# Patient Record
Sex: Male | Born: 1990 | Race: White | Hispanic: No | Smoking: Never smoker
Health system: Southern US, Community
[De-identification: ages and names within clinical notes are randomized; demographics above are authoritative.]

## PROBLEM LIST (undated history)

## (undated) DIAGNOSIS — M545 Low back pain, unspecified: Secondary | ICD-10-CM

## (undated) DIAGNOSIS — F952 Tourette's disorder: Secondary | ICD-10-CM

## (undated) DIAGNOSIS — F909 Attention-deficit hyperactivity disorder, unspecified type: Secondary | ICD-10-CM

## (undated) DIAGNOSIS — G8929 Other chronic pain: Secondary | ICD-10-CM

## (undated) HISTORY — DX: Low back pain, unspecified: M54.50

## (undated) HISTORY — PX: NO PAST SURGERIES: SHX2092

## (undated) HISTORY — DX: Low back pain: M54.5

## (undated) HISTORY — DX: Tourette's disorder: F95.2

## (undated) HISTORY — DX: Low back pain, unspecified: G89.29

## (undated) HISTORY — DX: Attention-deficit hyperactivity disorder, unspecified type: F90.9

---
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
## 2011-01-02 ENCOUNTER — Institutional Professional Consult (permissible substitution): Payer: Medicaid Other | Admitting: Pediatrics

## 2011-01-02 DIAGNOSIS — R279 Unspecified lack of coordination: Secondary | ICD-10-CM

## 2011-01-02 DIAGNOSIS — F909 Attention-deficit hyperactivity disorder, unspecified type: Secondary | ICD-10-CM

## 2011-01-02 DIAGNOSIS — R6250 Unspecified lack of expected normal physiological development in childhood: Secondary | ICD-10-CM

## 2011-01-18 ENCOUNTER — Ambulatory Visit (HOSPITAL_COMMUNITY): Payer: Medicaid Other | Admitting: Licensed Clinical Social Worker

## 2011-03-08 ENCOUNTER — Ambulatory Visit (HOSPITAL_COMMUNITY): Payer: Medicaid Other | Admitting: Physician Assistant

## 2011-07-18 ENCOUNTER — Institutional Professional Consult (permissible substitution): Payer: Medicaid Other | Admitting: Pediatrics

## 2011-07-18 DIAGNOSIS — F909 Attention-deficit hyperactivity disorder, unspecified type: Secondary | ICD-10-CM

## 2011-07-18 DIAGNOSIS — R279 Unspecified lack of coordination: Secondary | ICD-10-CM

## 2011-07-18 DIAGNOSIS — R6250 Unspecified lack of expected normal physiological development in childhood: Secondary | ICD-10-CM

## 2012-11-22 ENCOUNTER — Ambulatory Visit (INDEPENDENT_AMBULATORY_CARE_PROVIDER_SITE_OTHER): Payer: BC Managed Care – PPO | Admitting: Physician Assistant

## 2012-11-22 VITALS — BP 95/65 | HR 98 | Temp 99.0°F | Resp 18 | Ht 72.0 in | Wt 191.0 lb

## 2012-11-22 DIAGNOSIS — J029 Acute pharyngitis, unspecified: Secondary | ICD-10-CM

## 2012-11-22 LAB — POCT CBC
Granulocyte percent: 73 %G (ref 37–80)
HCT, POC: 46.3 % (ref 43.5–53.7)
Hemoglobin: 15.7 g/dL (ref 14.1–18.1)
MCV: 90 fL (ref 80–97)
POC Granulocyte: 5.7 (ref 2–6.9)
POC LYMPH PERCENT: 19.8 %L (ref 10–50)
RBC: 5.15 M/uL (ref 4.69–6.13)
RDW, POC: 13 %

## 2012-11-22 LAB — POCT RAPID STREP A (OFFICE): Rapid Strep A Screen: NEGATIVE

## 2012-11-22 LAB — POCT INFLUENZA A/B
Influenza A, POC: NEGATIVE
Influenza B, POC: NEGATIVE

## 2012-11-22 MED ORDER — MAGIC MOUTHWASH W/LIDOCAINE: 10.0000 mL | ORAL | Status: DC | PRN

## 2012-11-22 MED ORDER — AMOXICILLIN 875 MG PO TABS: 875.0000 mg | ORAL_TABLET | Freq: Two times a day (BID) | ORAL | Status: DC

## 2012-11-22 NOTE — Patient Instructions (Signed)
Get plenty of rest and drink at least 64 ounces of water daily. 

## 2012-11-22 NOTE — Progress Notes (Signed)
Subjective:    Patient ID: Guy Walton, male    DOB: 1990-12-12, 21 y.o.   MRN: [id number]  HPI This 22 y.o. male presents for evaluation of sore throat and fever (subjective), that began 2 nights ago at work.  Describes weakness, fatigue, sore throat.  Loss of appetite.  Slept most of yesterday, and when awoke, felt worse.  No GI/GU symptoms.  Bones feel a little achey, maybe in the muscles. Mild cough.  LEFT ear with mild pain.  No HA. No known sick contacts.  History reviewed. No pertinent past medical history.  History reviewed. No pertinent past surgical history.  Prior to Admission medications   Not on File    No Known Allergies  History   Social History  . Marital Status: Single    Spouse Name: n/a    Number of Children: 0  . Years of Education: N/A   Occupational History  . Student     [employer]; physical therapy assistant program  . mezzanzine selector     [employer]   Social History Main Topics  . Smoking status: Never Smoker   . Smokeless tobacco: Never Used  . Alcohol Use: No  . Drug Use: No  . Sexually Active: Yes -- Male partner(s)   Other Topics Concern  . Not on file   Social History Narrative   Lives with his grandparents.    History reviewed. No pertinent family history.   Review of Systems As above.    Objective:   Physical Exam  Blood pressure 95/65, pulse 98, temperature 99 F (37.2 C), temperature source Oral, resp. rate 18, height 6' (1.829 m), weight 191 lb (86.637 kg). Body mass index is 25.9 kg/(m^2). Well-developed, well nourished WM who is awake, alert and oriented, in NAD. HEENT: [place]/AT, PERRL, EOMI.  Sclera and conjunctiva are clear.  EAC are patent, TMs are normal in appearance. Nasal mucosa is pink and moist. OP is clear. Neck: supple, non-tender, no lymphadenopathy, thyromegaly. Heart: RRR, no murmur Lungs: normal effort, CTA Extremities: no cyanosis, clubbing or edema. Skin: warm and dry without  rash. Psychologic: good mood and appropriate affect, normal speech and behavior.   Results for orders placed in visit on 11/22/12  POCT RAPID STREP A (OFFICE)      Result Value Range   Rapid Strep A Screen Negative  Negative  POCT INFLUENZA A/B      Result Value Range   Influenza A, POC Negative     Influenza B, POC Negative    POCT CBC      Result Value Range   WBC 7.8  4.6 - 10.2 K/uL   Lymph, poc 1.5  0.6 - 3.4   POC LYMPH PERCENT 19.8  10 - 50 %L   MID (cbc) 0.6  0 - 0.9   POC MID % 7.2  0 - 12 %M   POC Granulocyte 5.7  2 - 6.9   Granulocyte percent 73.0  37 - 80 %G   RBC 5.15  4.69 - 6.13 M/uL   Hemoglobin 15.7  14.1 - 18.1 g/dL   HCT, POC 40.9  81.1 - 53.7 %   MCV 90.0  80 - 97 fL   MCH, POC 30.5  27 - 31.2 pg   MCHC 33.9  31.8 - 35.4 g/dL   RDW, POC 91.4     Platelet Count, POC 193  142 - 424 K/uL   MPV 9.7  0 - 99.8 fL  Assessment & Plan:  Acute pharyngitis - Plan: POCT rapid strep A, Throat culture Loney Loh), POCT Influenza A/B, POCT CBC, amoxicillin (AMOXIL) 875 MG tablet, Alum & Mag Hydroxide-Simeth (MAGIC MOUTHWASH W/LIDOCAINE) SOLN  Suspect viral syndrome, but given the severity of the sore throat, cover for strep pharyngitis pending TCx results.  Work note provided, and if unable to RTW tomorrow evening he's to call for an extension.  Supportive care.  Anticipatory guidance provided.

## 2012-11-25 LAB — CULTURE, GROUP A STREP

## 2012-11-27 ENCOUNTER — Ambulatory Visit (INDEPENDENT_AMBULATORY_CARE_PROVIDER_SITE_OTHER): Payer: BC Managed Care – PPO | Admitting: Emergency Medicine

## 2012-11-27 VITALS — BP 102/70 | HR 61 | Temp 98.3°F | Resp 20 | Ht 72.5 in | Wt 189.0 lb

## 2012-11-27 DIAGNOSIS — R197 Diarrhea, unspecified: Secondary | ICD-10-CM

## 2012-11-27 DIAGNOSIS — R112 Nausea with vomiting, unspecified: Secondary | ICD-10-CM

## 2012-11-27 DIAGNOSIS — A088 Other specified intestinal infections: Secondary | ICD-10-CM

## 2012-11-27 LAB — POCT CBC
Hemoglobin: 13.7 g/dL — AB (ref 14.1–18.1)
Lymph, poc: 2 (ref 0.6–3.4)
MCH, POC: 29 pg (ref 27–31.2)
MCHC: 31.9 g/dL (ref 31.8–35.4)
MPV: 9.3 fL (ref 0–99.8)
POC Granulocyte: 4.6 (ref 2–6.9)
POC LYMPH PERCENT: 27.7 %L (ref 10–50)
POC MID %: 9 %M (ref 0–12)
RDW, POC: 13.1 %
WBC: 7.3 10*3/uL (ref 4.6–10.2)

## 2012-11-27 MED ORDER — LOPERAMIDE HCL 2 MG PO TABS: ORAL_TABLET | ORAL | Status: DC

## 2012-11-27 MED ORDER — ONDANSETRON 8 MG PO TBDP
8.0000 mg | ORAL_TABLET | Freq: Three times a day (TID) | ORAL | Status: DC | PRN
Start: 2012-11-27 — End: 2013-01-12

## 2012-11-27 NOTE — Patient Instructions (Addendum)
Viral Gastroenteritis Viral gastroenteritis is also known as stomach flu. This condition affects the stomach and intestinal tract. It can cause sudden diarrhea and vomiting. The illness typically lasts 3 to 8 days. Most people develop an immune response that eventually gets rid of the virus. While this natural response develops, the virus can make you quite ill. CAUSES  Many different viruses can cause gastroenteritis, such as rotavirus or noroviruses. You can catch one of these viruses by consuming contaminated food or water. You may also catch a virus by sharing utensils or other personal items with an infected person or by touching a contaminated surface. SYMPTOMS  The most common symptoms are diarrhea and vomiting. These problems can cause a severe loss of body fluids (dehydration) and a body salt (electrolyte) imbalance. Other symptoms may include:  Fever.  Headache.  Fatigue.  Abdominal pain. DIAGNOSIS  Your caregiver can usually diagnose viral gastroenteritis based on your symptoms and a physical exam. A stool sample may also be taken to test for the presence of viruses or other infections. TREATMENT  This illness typically goes away on its own. Treatments are aimed at rehydration. The most serious cases of viral gastroenteritis involve vomiting so severely that you are not able to keep fluids down. In these cases, fluids must be given through an intravenous line (IV). HOME CARE INSTRUCTIONS   Drink enough fluids to keep your urine clear or pale yellow. Drink small amounts of fluids frequently and increase the amounts as tolerated.  Ask your caregiver for specific rehydration instructions.  Avoid:  Foods high in sugar.  Alcohol.  Carbonated drinks.  Tobacco.  Juice.  Caffeine drinks.  Extremely hot or cold fluids.  Fatty, greasy foods.  Too much intake of anything at one time.  Dairy products until 24 to 48 hours after diarrhea stops.  You may consume probiotics.  Probiotics are active cultures of beneficial bacteria. They may lessen the amount and number of diarrheal stools in adults. Probiotics can be found in yogurt with active cultures and in supplements.  Wash your hands well to avoid spreading the virus.  Only take over-the-counter or prescription medicines for pain, discomfort, or fever as directed by your caregiver. Do not give aspirin to children. Antidiarrheal medicines are not recommended.  Ask your caregiver if you should continue to take your regular prescribed and over-the-counter medicines.  Keep all follow-up appointments as directed by your caregiver. SEEK IMMEDIATE MEDICAL CARE IF:   You are unable to keep fluids down.  You do not urinate at least once every 6 to 8 hours.  You develop shortness of breath.  You notice blood in your stool or vomit. This may look like coffee grounds.  You have abdominal pain that increases or is concentrated in one small area (localized).  You have persistent vomiting or diarrhea.  You have a fever.  The patient is a child younger than 3 months, and he or she has a fever.  The patient is a child older than 3 months, and he or she has a fever and persistent symptoms.  The patient is a child older than 3 months, and he or she has a fever and symptoms suddenly get worse.  The patient is a baby, and he or she has no tears when crying. MAKE SURE YOU:   Understand these instructions.  Will watch your condition.  Will get help right away if you are not doing well or get worse. Document Released: 09/03/2005 Document Revised: 11/26/2011 Document Reviewed: 06/20/2011   ExitCare Patient Information 2013 [employer].  

## 2012-11-27 NOTE — Progress Notes (Signed)
[facility] [address], [place] [address] [phone]  Date:  11/27/2012   Name:  Guy Walton   DOB:  Jan 01, 1991   MRN:  [id number]  PCP:  Magdalen Spatz., MD    Chief Complaint: Diarrhea and Emesis   History of Present Illness:  Guy Walton is a 22 y.o. 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  Felt mid abdominal pain and had nausea and sensation to move his bowels.  Finally vomited and had one episode of loose watery stools.  No fever or chills.  The patient has no complaint of blood, mucous, or pus in her stools.  Still has nausea and abdominal pain.  No sick contacts.  Friday developed sore throat and treated for strep.  Has been on amoxicillin since.  There is no problem list on file for this patient.   No past medical history on file.  No past surgical history on file.  History  Substance Use Topics  . Smoking status: Never Smoker   . Smokeless tobacco: Never Used  . Alcohol Use: No    No family history on file.  No Known Allergies  Medication list has been reviewed and updated.  Current Outpatient Prescriptions on File Prior to Visit  Medication Sig Dispense Refill  . amoxicillin (AMOXIL) 875 MG tablet Take 1 tablet (875 mg total) by mouth 2 (two) times daily.  20 tablet  0  . Alum & Mag Hydroxide-Simeth (MAGIC MOUTHWASH W/LIDOCAINE) SOLN Take 10 mLs by mouth every 2 (two) hours as needed.  360 mL  0   No current facility-administered medications on file prior to visit.    Review of Systems:  As per HPI, otherwise negative.    Physical Examination: Filed Vitals:   11/27/12 1908  BP: 102/70  Pulse: 61  Temp: 98.3 F (36.8 C)  Resp: 20   Filed Vitals:   11/27/12 1908  Height: 6' 0.5" (1.842 m)  Weight: 189 lb (85.73 kg)   Body mass index is 25.27 kg/(m^2). Ideal Body Weight: Weight in (lb) to have BMI = 25: 186.5  GEN: WDWN, NAD, Non-toxic, A  & O x 3 HEENT: Atraumatic, Normocephalic. Neck supple. No masses, No LAD. Ears and Nose: No external deformity. CV: RRR, No M/G/R. No JVD. No thrill. No extra heart sounds. PULM: CTA B, no wheezes, crackles, rhonchi. No retractions. No resp. distress. No accessory muscle use. ABD: S, mild LLQ tenderness, ND, +BS. No rebound. No HSM. EXTR: No c/c/e NEURO Normal gait.  PSYCH: Normally interactive. Conversant. Not depressed or anxious appearing.  Calm demeanor.    Assessment and Plan: Nausea and vomiting Current antibiotic treatment of strep with amoxicillin Consider C.dif  Carmelina Dane, MD  Results for orders placed in visit on 11/27/12  POCT CBC      Result Value Range   WBC 7.3  4.6 - 10.2 K/uL   Lymph, poc 2.0  0.6 - 3.4   POC LYMPH PERCENT 27.7  10 - 50 %L   MID (cbc) 0.7  0 - 0.9   POC MID % 9.0  0 - 12 %M   POC Granulocyte 4.6  2 - 6.9   Granulocyte percent 63.3  37 - 80 %G   RBC 4.72  4.69 - 6.13 M/uL   Hemoglobin 13.7 (*) 14.1 - 18.1 g/dL   HCT, POC 32.4 (*) 40.1 - 53.7 %  MCV 90.8  80 - 97 fL   MCH, POC 29.0  27 - 31.2 pg   MCHC 31.9  31.8 - 35.4 g/dL   RDW, POC 16.1     Platelet Count, POC 179  142 - 424 K/uL   MPV 9.3  0 - 99.8 fL

## 2012-12-03 LAB — CLOSTRIDIUM DIFFICILE EIA: CDIFTX: NEGATIVE

## 2013-01-12 ENCOUNTER — Ambulatory Visit: Payer: BC Managed Care – PPO

## 2013-01-12 ENCOUNTER — Ambulatory Visit (INDEPENDENT_AMBULATORY_CARE_PROVIDER_SITE_OTHER): Payer: BC Managed Care – PPO | Admitting: Emergency Medicine

## 2013-01-12 VITALS — BP 128/82 | HR 69 | Temp 98.5°F | Resp 16 | Ht 73.0 in | Wt 185.0 lb

## 2013-01-12 DIAGNOSIS — R112 Nausea with vomiting, unspecified: Secondary | ICD-10-CM

## 2013-01-12 DIAGNOSIS — R109 Unspecified abdominal pain: Secondary | ICD-10-CM

## 2013-01-12 DIAGNOSIS — R197 Diarrhea, unspecified: Secondary | ICD-10-CM

## 2013-01-12 MED ORDER — PROMETHAZINE HCL 25 MG PO TABS: 25.0000 mg | ORAL_TABLET | Freq: Four times a day (QID) | ORAL | Status: DC | PRN

## 2013-01-12 MED ORDER — DICYCLOMINE HCL 10 MG PO CAPS: 10.0000 mg | ORAL_CAPSULE | Freq: Three times a day (TID) | ORAL | Status: DC

## 2013-01-12 NOTE — Patient Instructions (Addendum)
Drink plenty of fluids, and gradually advance your diet as you are able.  The sooner you resume your normal diet, the sooner the diarrhea will resolve.  To help reduce constipation and promote bowel health, 1. Drink at least 64 ounces of water each day; 2. Eat plenty of fiber (fruits, vegetables, whole grains, legumes) 3. Get plenty of physical activity  If needed, use a stool softener (docusate) or an osmotic laxative (like Miralax) each day, or as needed.

## 2013-01-12 NOTE — Progress Notes (Signed)
  Subjective:    Patient ID: GAUGE WINSKI, male    DOB: Sep 21, 1990, 21 y.o.   MRN: [id number]  HPI  This 22 y.o. male presents for evaluation of abdominal pain and diarrhea. Awoke from sleep last evening with abdominal pain and need to defecate-diarrhea.  All night at work had diarrhea, and rectum is sore.  Vomited x 1 yesterday after eating chicken pot pie and baked beans, nausea is constant. 16-20 oz Gatorade overnight. Multiple contacts with similar symptoms at work.  Additionally, he notes several weeks of nasal and sinus congestion and pressure.  Worse on the LEFT.  Is interested in advice on OTC products that can help with allergies.  Review of Systems As above.    Objective:   Physical Exam Blood pressure 128/82, pulse 69, temperature 98.5 F (36.9 C), temperature source Oral, resp. rate 16, height 6\' 1"  (1.854 m), weight 185 lb (83.915 kg), SpO2 99.00%. Body mass index is 24.41 kg/(m^2). Well-developed, well nourished WM who is awake, alert and oriented, in NAD. HEENT: [place]/AT, PERRL, EOMI.  Sclera and conjunctiva are clear.  EAC are patent, TMs are normal in appearance. Nasal mucosa is pink and moist. OP is clear. Neck: supple, non-tender, no lymphadenopathy, thyromegaly. Heart: RRR, no murmur Lungs: normal effort, CTA Abdomen: normo-active bowel sounds, supple, no mass or organomegaly. Tenderness in the LEFT quadrants and epigastrum. Extremities: no cyanosis, clubbing or edema. Skin: warm and dry without rash. Psychologic: good mood and appropriate affect, normal speech and behavior.  Acute abdominal series: [facility] reading (PRIMARY) by  Dr. Dareen Piano.  No free air.  No evidence of ileus or obstruction. No masses. No acute findings. Increased stool in the ascending colon. Non-specific bowel-gas pattern.     Assessment & Plan:  Nausea with vomiting - Plan: promethazine (PHENERGAN) 25 MG tablet  Abdominal pain - Plan: DG Abd Acute W/Chest, dicyclomine (BENTYL) 10 MG  capsule  Diarrhea - hydration.  Advance diet as tolerated.   RTC if symptoms worsen/persist.  Fernande Bras, PA-C Physician Assistant-Certified [facility]

## 2013-04-10 ENCOUNTER — Other Ambulatory Visit: Payer: Self-pay | Admitting: Family Medicine

## 2013-04-10 DIAGNOSIS — M545 Low back pain: Secondary | ICD-10-CM

## 2013-04-10 DIAGNOSIS — M79605 Pain in left leg: Secondary | ICD-10-CM

## 2013-04-15 ENCOUNTER — Other Ambulatory Visit: Payer: Medicaid Other

## 2013-06-15 ENCOUNTER — Ambulatory Visit (INDEPENDENT_AMBULATORY_CARE_PROVIDER_SITE_OTHER): Payer: BC Managed Care – PPO | Admitting: Family Medicine

## 2013-06-15 VITALS — BP 124/80 | HR 66 | Temp 98.4°F | Resp 16 | Ht 73.0 in | Wt 181.0 lb

## 2013-06-15 DIAGNOSIS — J029 Acute pharyngitis, unspecified: Secondary | ICD-10-CM

## 2013-06-15 DIAGNOSIS — J069 Acute upper respiratory infection, unspecified: Secondary | ICD-10-CM

## 2013-06-15 MED ORDER — IPRATROPIUM BROMIDE 0.03 % NA SOLN: 2.0000 | Freq: Two times a day (BID) | NASAL | Status: DC

## 2013-06-15 MED ORDER — GUAIFENESIN-CODEINE 100-10 MG/5ML PO SOLN: 5.0000 mL | Freq: Three times a day (TID) | ORAL | Status: DC | PRN

## 2013-06-15 NOTE — Progress Notes (Signed)
Subjective:    Patient ID: Guy Walton, male    DOB: 31-Jan-1991, 22 y.o.   MRN: [id number] Chief Complaint  Patient presents with  . Sore Throat    x 1 day  . Otalgia     HPI  Started to feel ill about 2d ago with left ear pain, now having bilateral ear pain and starting to have a sore throat. Waking frequently during sleep. Lots of sinus pressure and nasal congestion productive of clear mucous.  No cough. No f/c/sweats, no myalgias/arthralgias.  Has been taking ibuprofen and mucinex for some relief.  His little sister is also ill with similar type of illness.  Was not able to go into work last night or tonight due to sxs.  History reviewed. No pertinent past medical history. No current outpatient prescriptions on file prior to visit.   No current facility-administered medications on file prior to visit.   No Known Allergies    Review of Systems  Constitutional: Positive for fatigue. Negative for fever, chills, diaphoresis, activity change, appetite change and unexpected weight change.  HENT: Positive for ear pain, congestion, sore throat, rhinorrhea, postnasal drip and sinus pressure. Negative for hearing loss, mouth sores, neck pain, neck stiffness and ear discharge.   Respiratory: Negative for cough and shortness of breath.   Cardiovascular: Negative for chest pain.  Gastrointestinal: Negative for nausea, vomiting, abdominal pain, diarrhea and constipation.  Genitourinary: Negative for dysuria.  Musculoskeletal: Negative for myalgias and arthralgias.  Skin: Negative for rash.  Neurological: Positive for headaches. Negative for syncope.  Hematological: Negative for adenopathy.  Psychiatric/Behavioral: Positive for sleep disturbance.      BP 124/80  Pulse 66  Temp(Src) 98.4 F (36.9 C) (Oral)  Resp 16  Ht 6\' 1"  (1.854 m)  Wt 181 lb (82.101 kg)  BMI 23.89 kg/m2  SpO2 99%  Objective:   Physical Exam  Constitutional: He is oriented to person, place, and time. He  appears well-developed and well-nourished. No distress.  HENT:  Head: Normocephalic and atraumatic.  Right Ear: Tympanic membrane, external ear and ear canal normal.  Left Ear: Tympanic membrane, external ear and ear canal normal.  Nose: Nose normal.  Mouth/Throat: Uvula is midline and mucous membranes are normal. No trismus in the jaw. No edematous. Posterior oropharyngeal edema present. No oropharyngeal exudate, posterior oropharyngeal erythema or tonsillar abscesses.  Eyes: Conjunctivae are normal. No scleral icterus.  Neck: Normal range of motion. Neck supple. No thyromegaly present.  Cardiovascular: Normal rate, regular rhythm, normal heart sounds and intact distal pulses.   Pulmonary/Chest: Effort normal and breath sounds normal. No respiratory distress.  Musculoskeletal: He exhibits no edema.  Lymphadenopathy:    He has no cervical adenopathy.  Neurological: He is alert and oriented to person, place, and time.  Skin: Skin is warm and dry. He is not diaphoretic. No erythema.  Psychiatric: He has a normal mood and affect. His behavior is normal.      Results for orders placed in visit on 06/15/13  POCT RAPID STREP A (OFFICE)      Result Value Range   Rapid Strep A Screen Negative  Negative   Assessment & Plan:  Acute pharyngitis - Plan: POCT rapid strep A, Culture, Group A Strep  Acute URI  Meds ordered this encounter  Medications  . ipratropium (ATROVENT) 0.03 % nasal spray    Sig: Place 2 sprays into the nose 2 (two) times daily.    Dispense:  30 mL    Refill:  5  .  guaiFENesin-codeine 100-10 MG/5ML syrup    Sig: Take 5 mLs by mouth 3 (three) times daily as needed for cough.    Dispense:  120 mL    Refill:  0

## 2013-06-15 NOTE — Patient Instructions (Signed)
I recommend frequent warm salt water gargles, hot tea with honey and lemon, rest, and handwashing.  Hot showers or breathing in steam may help loosen the congestion.  Try a netti pot or sinus rinse is also likely to help you feel better and keep this from progressing.  Upper Respiratory Infection, Adult An upper respiratory infection (URI) is also known as the common cold. It is often caused by a type of germ (virus). Colds are easily spread (contagious). You can pass it to others by kissing, coughing, sneezing, or drinking out of the same glass. Usually, you get better in 1 or 2 weeks.  HOME CARE   Only take medicine as told by your doctor.  Use a warm mist humidifier or breathe in steam from a hot shower.  Drink enough water and fluids to keep your pee (urine) clear or pale yellow.  Get plenty of rest.  Return to work when your temperature is back to normal or as told by your doctor. You may use a face mask and wash your hands to stop your cold from spreading. GET HELP RIGHT AWAY IF:   After the first few days, you feel you are getting worse.  You have questions about your medicine.  You have chills, shortness of breath, or brown or red spit (mucus).  You have yellow or brown snot (nasal discharge) or pain in the face, especially when you bend forward.  You have a fever, puffy (swollen) neck, pain when you swallow, or white spots in the back of your throat.  You have a bad headache, ear pain, sinus pain, or chest pain.  You have a high-pitched whistling sound when you breathe in and out (wheezing).  You have a lasting cough or cough up blood.  You have sore muscles or a stiff neck. MAKE SURE YOU:   Understand these instructions.  Will watch your condition.  Will get help right away if you are not doing well or get worse. Document Released: 02/20/2008 Document Revised: 11/26/2011 Document Reviewed: 01/08/2011 [facility] Patient Information 2014 [place], [place].

## 2013-08-16 ENCOUNTER — Encounter: Payer: Self-pay | Admitting: Emergency Medicine

## 2013-08-16 ENCOUNTER — Emergency Department
Admission: EM | Admit: 2013-08-16 | Discharge: 2013-08-16 | Disposition: A | Discharge status: Home / Self Care | Payer: BC Managed Care – PPO | Source: Home / Self Care | Attending: Family Medicine | Admitting: Family Medicine

## 2013-08-16 DIAGNOSIS — M26629 Arthralgia of temporomandibular joint, unspecified side: Secondary | ICD-10-CM

## 2013-08-16 DIAGNOSIS — H6980 Other specified disorders of Eustachian tube, unspecified ear: Secondary | ICD-10-CM

## 2013-08-16 DIAGNOSIS — H6983 Other specified disorders of Eustachian tube, bilateral: Secondary | ICD-10-CM

## 2013-08-16 MED ORDER — AMOXICILLIN 875 MG PO TABS: 875.0000 mg | ORAL_TABLET | Freq: Two times a day (BID) | ORAL | Status: DC

## 2013-08-16 MED ORDER — PREDNISONE 20 MG PO TABS: 20.0000 mg | ORAL_TABLET | Freq: Two times a day (BID) | ORAL | Status: DC

## 2013-08-16 MED ORDER — FLUTICASONE PROPIONATE 50 MCG/ACT NA SUSP: NASAL | Status: DC

## 2013-08-16 NOTE — ED Notes (Signed)
Guy Walton complains of left ear pain on and off for 1 week. He also reports headaches and chills for 1 day.

## 2013-08-16 NOTE — ED Provider Notes (Signed)
CSN: [id number]     Arrival date & time 08/16/13  1741 History   First MD Initiated Contact with Patient 08/16/13 1753     Chief Complaint  Patient presents with  . Headache    x 1 day  . Chills    one night  . Otalgia    x 1 week      HPI Comments: Patient complains of chronic nasal congestion (exacerbated by his work environment) that has become worse over the past week.  Yesterday he developed increasing facial pain and mild headache, and last night he had chills.  He has had an intermittent left earache for about 8 days.  The history is provided by the patient.    History reviewed. No pertinent past medical history. History reviewed. No pertinent past surgical history. History reviewed. No pertinent family history. History  Substance Use Topics  . Smoking status: Never Smoker   . Smokeless tobacco: Never Used  . Alcohol Use: No    Review of Systems No sore throat No cough No pleuritic pain No wheezing + nasal congestion + post-nasal drainage + sinus pain/pressure No itchy/red eyes + left earache No hemoptysis No SOB No fever, + chills No nausea No vomiting No abdominal pain No diarrhea No urinary symptoms No skin rash No fatigue No myalgias + headache Used OTC meds without relief  Allergies  Review of patient's allergies indicates no known allergies.  Home Medications   Current Outpatient Rx  Name  Route  Sig  Dispense  Refill  . amoxicillin (AMOXIL) 875 MG tablet   Oral   Take 1 tablet (875 mg total) by mouth 2 (two) times daily.   20 tablet   0   . fluticasone (FLONASE) 50 MCG/ACT nasal spray      Place 2 sprays in each nostril once daily   16 g   1   . ipratropium (ATROVENT) 0.03 % nasal spray   Nasal   Place 2 sprays into the nose 2 (two) times daily.   30 mL   5   . predniSONE (DELTASONE) 20 MG tablet   Oral   Take 1 tablet (20 mg total) by mouth 2 (two) times daily.   10 tablet   0    BP 145/80  Pulse 88  Temp(Src) 98.2 F  (36.8 C) (Oral)  Ht 6\' 1"  (1.854 m)  Wt 181 lb (82.101 kg)  BMI 23.89 kg/m2  SpO2 97% Physical Exam Nursing notes and Vital Signs reviewed. Appearance:  Patient appears healthy, stated age, and in no acute distress Eyes:  Pupils are equal, round, and reactive to light and accomodation.  Extraocular movement is intact.  Conjunctivae are not inflamed  Ears:  Canals normal.  Tympanic membranes normal. There is distinct tenderness over the left temporomandibular joint.  Palpation there recreates his pain.    Nose:  Mildly congested turbinates.  Mild maxillary sinus tenderness  Pharynx:  Normal Neck:  Supple.  No adenopathy Lungs:  Clear to auscultation.  Breath sounds are equal.  Heart:  Regular rate and rhythm without murmurs, rubs, or gallops.  Abdomen:  Nontender without masses or hepatosplenomegaly.  Bowel sounds are present.  No CVA or flank tenderness.  Extremities:  No edema.  No calf tenderness Skin:  No rash present.   ED Course  Procedures  none   Labs Reviewed -  Tympanogram:  Low peak height both ears ("flat line")       MDM   1. Eustachian tube dysfunction,  bilateral; ? sinusitis   2. TMJ arthralgia    Begin prednisone burst, and amoxicillin Take Mucinex D (guaifenesin with decongestant) twice daily for congestion.  Increase fluid intake, rest. May use Afrin nasal spray (or generic oxymetazoline) twice daily for about 5 days.  Also recommend using saline nasal spray several times daily and saline nasal irrigation (AYR is a common brand).  Use Fluticasone spray after Afrin and saline sprays. Stop all antihistamines for now, and other non-prescription cough/cold preparations. Apply ice pack to left TMJ 2 or 3 times daily.  Avoid chewy foods.  Followup with ENT if not improved about 10 days    Lattie Haw, MD 08/17/13 1324

## 2013-09-13 ENCOUNTER — Ambulatory Visit (INDEPENDENT_AMBULATORY_CARE_PROVIDER_SITE_OTHER): Payer: BC Managed Care – PPO | Admitting: Internal Medicine

## 2013-09-13 VITALS — BP 110/70 | HR 80 | Temp 98.1°F | Resp 16 | Ht 74.0 in | Wt 181.0 lb

## 2013-09-13 DIAGNOSIS — J029 Acute pharyngitis, unspecified: Secondary | ICD-10-CM

## 2013-09-13 DIAGNOSIS — R05 Cough: Secondary | ICD-10-CM

## 2013-09-13 MED ORDER — LIDOCAINE VISCOUS 2 % MT SOLN: OROMUCOSAL | Status: DC

## 2013-09-13 MED ORDER — MELOXICAM 15 MG PO TABS: 15.0000 mg | ORAL_TABLET | Freq: Every day | ORAL | Status: DC

## 2013-09-13 MED ORDER — HYDROCODONE-HOMATROPINE 5-1.5 MG/5ML PO SYRP: 5.0000 mL | ORAL_SOLUTION | Freq: Four times a day (QID) | ORAL | Status: DC | PRN

## 2013-09-13 MED ORDER — CYCLOBENZAPRINE HCL 10 MG PO TABS: 10.0000 mg | ORAL_TABLET | Freq: Every day | ORAL | Status: DC

## 2013-09-13 NOTE — Progress Notes (Addendum)
Subjective:    Patient ID: Guy Walton, male    DOB: 1991-04-23, 22 y.o.   MRN: [id number]  HPI This chart was scribed for [facility] by Smiley Houseman, Scribe. This patient was seen in room 12 and the patient's care was started at 3:45 PM.  HPI Comments: Guy Walton is a 22 y.o. male who presents to the [facility] complaining of persistent non-productive cough that began about 4 days ago.  He has associated nasal congestion, rhinorrhea, and sore throat.  Pt denies HA, nausea, and vomiting.  He states he did have a sick contact at home.      History reviewed. No pertinent past surgical history.  History reviewed. No pertinent family history.  History   Social History   Marital Status: Single    Spouse Name: n/a    Number of Children: 0   Years of Education: N/A   Occupational History   Psychologist, sport and exercise; physical therapy assistant program   mezzanzine selector     [employer]   Social History Main Topics   Smoking status: Never Smoker    Smokeless tobacco: Never Used   Alcohol Use: No   Drug Use: No   Sexual Activity: Yes    Partners: Female   Other Topics Concern   Not on file   Social History Narrative   Lives with his grandparents.    Review of Systems  Constitutional: Negative for fever and chills.  HENT: Positive for congestion (Nasal), rhinorrhea and sore throat.   Respiratory: Positive for cough. Negative for shortness of breath.   Cardiovascular: Negative for chest pain.  Gastrointestinal: Negative for nausea, vomiting, abdominal pain and diarrhea.  Musculoskeletal: Negative for back pain.  Skin: Negative for color change and rash.       Objective:   Physical Exam  Nursing note and vitals reviewed. Constitutional: He is oriented to person, place, and time. He appears well-developed and well-nourished. No distress.  HENT:  Head: Normocephalic and atraumatic.  Eyes:  Conjunctivae and EOM are normal. Right eye exhibits no discharge. Left eye exhibits no discharge.  Neck: Normal range of motion.  Cardiovascular: Normal rate and regular rhythm.   Pulmonary/Chest: Effort normal and breath sounds normal. No respiratory distress.  Musculoskeletal: Normal range of motion.  Neurological: He is alert and oriented to person, place, and time.  Skin: Skin is warm and dry.  Psychiatric: He has a normal mood and affect. His behavior is normal.   Triage Vitals: BP 110/70   Pulse 80   Temp(Src) 98.1 F (36.7 C) (Oral)   Resp 16   Ht 6\' 2"  (1.88 m)   Wt 181 lb (82.101 kg)   BMI 23.23 kg/m2   SpO2 99%  DIAGNOSTIC STUDIES: Oxygen Saturation is 99% on RA, normal by my interpretation.    COORDINATION OF CARE: 3:50 PM-Patient informed of current plan of treatment and evaluation and agrees with plan.        Assessment & Plan:  Acute pharyngitis - Plan: POCT rapid strep A, Culture, Group A Strep  Cough  Meds ordered this encounter  Medications   lidocaine (XYLOCAINE) 2 % solution    Sig: Gargle and swallow or spit with 1 teaspoon every 2-3 hours as needed for pain    Dispense:  100 mL    Refill:  0   DISCONTD: cyclobenzaprine (FLEXERIL) 10 MG tablet    Sig: Take 1 tablet (10 mg total)  by mouth at bedtime.    Dispense:  30 tablet    Refill:  0   DISCONTD: HYDROcodone-homatropine (HYCODAN) 5-1.5 MG/5ML syrup    Sig: Take 5 mLs by mouth every 6 (six) hours as needed for cough.    Dispense:  120 mL    Refill:  0   DISCONTD: meloxicam (MOBIC) 15 MG tablet    Sig: Take 1 tablet (15 mg total) by mouth daily.    Dispense:  30 tablet    Refill:  0      I have completed the patient encounter in its entirety as documented by the scribe, with editing by me where necessary. Robert P. Merla Riches, M.D.

## 2014-05-07 ENCOUNTER — Ambulatory Visit (INDEPENDENT_AMBULATORY_CARE_PROVIDER_SITE_OTHER): Payer: BC Managed Care – PPO | Admitting: Physician Assistant

## 2014-05-07 VITALS — BP 110/72 | HR 64 | Temp 99.2°F | Resp 18 | Ht 72.5 in | Wt 183.0 lb

## 2014-05-07 DIAGNOSIS — S2020XA Contusion of thorax, unspecified, initial encounter: Secondary | ICD-10-CM

## 2014-05-07 DIAGNOSIS — S300XXA Contusion of lower back and pelvis, initial encounter: Secondary | ICD-10-CM

## 2014-05-07 MED ORDER — CYCLOBENZAPRINE HCL 10 MG PO TABS: 10.0000 mg | ORAL_TABLET | Freq: Every evening | ORAL | Status: DC | PRN

## 2014-05-07 NOTE — Progress Notes (Signed)
   Subjective:    Patient ID: Guy Walton, male    DOB: [birth year], 23 y.o.   MRN: [id number]   PCP: Magdalen SpatzWALKER, KIRK W., MD  Chief Complaint  Patient presents with  . Tailbone Pain    fell last tuesday pain with sitting and some walking   . Back Pain    Medications, allergies, past medical history, surgical history, family history, social history and problem list reviewed and updated.  HPI  10 days ago he fell while playing with his dog and landed on the RIGHT posterior hip and elbow on concrete. Continues to have pain at the posterior hip/low back, especially when he first gets up to walk. Advil helps when he takes it, but he's not taking it regularly.  No loss of bowel or bladder control. No pain radiating to the legs or groin.  No paresthesias. No lower extremity weakness.  Review of Systems As above.    Objective:   Physical Exam  Constitutional: He is oriented to person, place, and time. He appears well-developed and well-nourished. No distress.  BP 110/72  Pulse 64  Temp(Src) 99.2 F (37.3 C) (Oral)  Resp 18  Ht 6' 0.5" (1.842 m)  Wt 183 lb (83.008 kg)  BMI 24.46 kg/m2  SpO2 98%   Pulmonary/Chest: Effort normal.  Musculoskeletal:       Right hip: Normal.       Left hip: Normal.       Lumbar back: Normal.       Back:  Neurological: He is alert and oriented to person, place, and time. He has normal strength. He displays normal reflexes. No sensory deficit.  Reflex Scores:      Patellar reflexes are 2+ on the right side and 2+ on the left side.      Achilles reflexes are 2+ on the right side and 2+ on the left side. Skin: Skin is warm and dry.  Psychiatric: He has a normal mood and affect. His behavior is normal.          Assessment & Plan:  1. Contusion of pelvis, initial encounter Increase Advil to BID. Add flexeril at bedtime. Rest. RTC if symptoms worsen/persist. - cyclobenzaprine (FLEXERIL) 10 MG tablet; Take 1 tablet (10 mg total) by mouth at  bedtime as needed for muscle spasms (stiffness).  Dispense: 20 tablet; Refill: 0   Fernande Brashelle S. Meri Pelot, PA-C Physician Assistant-Certified [facility]

## 2014-05-07 NOTE — Patient Instructions (Signed)
Continue the Advil, twice each day for about 2 weeks. Use the muscle relaxer only at bedtime, as it can cause sleepiness.

## 2014-07-06 ENCOUNTER — Other Ambulatory Visit: Payer: Self-pay | Admitting: Family Medicine

## 2014-07-06 DIAGNOSIS — M5432 Sciatica, left side: Secondary | ICD-10-CM

## 2014-07-07 ENCOUNTER — Ambulatory Visit
Admission: RE | Admit: 2014-07-07 | Discharge: 2014-07-07 | Disposition: A | Discharge status: Home / Self Care | Payer: BC Managed Care – PPO | Source: Ambulatory Visit | Attending: Family Medicine | Admitting: Family Medicine

## 2014-07-07 DIAGNOSIS — M5432 Sciatica, left side: Secondary | ICD-10-CM

## 2014-07-14 ENCOUNTER — Telehealth: Payer: Self-pay | Admitting: Neurology

## 2014-07-14 NOTE — Telephone Encounter (Signed)
Pt canceled new patient appt on 08-24-14 and resch to 08-31-14 notified referring dr also

## 2014-08-24 ENCOUNTER — Ambulatory Visit: Payer: BC Managed Care – PPO | Admitting: Neurology

## 2014-08-30 ENCOUNTER — Encounter: Payer: Self-pay | Admitting: *Deleted

## 2014-08-31 ENCOUNTER — Encounter: Payer: Self-pay | Admitting: Neurology

## 2014-08-31 ENCOUNTER — Ambulatory Visit (INDEPENDENT_AMBULATORY_CARE_PROVIDER_SITE_OTHER): Payer: BC Managed Care – PPO | Admitting: Neurology

## 2014-08-31 VITALS — BP 116/62 | HR 72 | Ht 73.0 in | Wt 186.0 lb

## 2014-08-31 DIAGNOSIS — M79605 Pain in left leg: Secondary | ICD-10-CM

## 2014-08-31 NOTE — Progress Notes (Signed)
NOTE FAXED

## 2014-08-31 NOTE — Progress Notes (Signed)
[facility] Clinic Note - Initial Visit   Date: 08/31/2014  Guy Walton MRN: [id number] DOB: 01-04-1991   Dear Dr. Azucena Cecil:  Thank you for your kind referral of Guy Walton for consultation of left leg pain. Although his history is well known to you, please allow [place] to reiterate it for the purpose of our medical record. The patient was accompanied to the clinic by aunt who also provides collateral information.     History of Present Illness: Guy Walton is a 23 y.o. right-handed Caucasian male with ADHD and anxiety presenting for evaluation of left leg pain.    Since the age of 9, he reports to having low back pain which started after a sports-related injury when his back landed on another player's knee. He had another sports related injury in high school for which he had physical therapy as a sophomore, but no lasting benefit.  He has always had back pain and since 2011, he noticed shooting pain over this left leg and into his foot.  With hip flexion, his pain radiates into his left groin.   He gets relief with chiropractic adjustments. Pain is fluctuating and worse with activity and rest.  It can be achy and dull and other times, it is sharp.  Pain is worse over the left buttocks, hip, lateral thigh, lower leg, and foot. There is no associated weakness or numbness/tingling.  He had MRI of the lumbar spine in October 2015 which was normal.     Out-side paper records, electronic medical record, and images have been reviewed where available and summarized as:  MRI lumbar spine wo contrast 07/07/2014:  Normal   Past Medical History  Diagnosis Date  . ADHD (attention deficit hyperactivity disorder)   . Tourette's     questionable per family  . Chronic low back pain     Past Surgical History  Procedure Laterality Date  . No past surgeries       Medications:  No current outpatient prescriptions on file prior to visit.   No current  facility-administered medications on file prior to visit.    Allergies: No Known Allergies  Family History: Family History  Problem Relation Age of Onset  . Hypercholesterolemia Paternal Grandfather     Social History: History   Social History  . Marital Status: Single    Spouse Name: n/a    Number of Children: 0  . Years of Education: N/A   Occupational History  . 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 Smoking status: Never Smoker   . Smokeless tobacco: Never Used  . Alcohol Use: No  . Drug Use: No  . Sexual Activity:    Partners: Female   Other Topics Concern  . Not on file   Social History Narrative   Lives with his grandparents.    Review of Systems:  CONSTITUTIONAL: No fevers, chills, night sweats, or weight loss.   EYES: No visual changes or eye pain ENT: No hearing changes.  No history of nose bleeds.   RESPIRATORY: No cough, wheezing and shortness of breath.   CARDIOVASCULAR: Negative for chest pain, and palpitations.   GI: Negative for abdominal discomfort, blood in stools or black stools.  No recent change in bowel habits.   GU:  No history of incontinence.   MUSCLOSKELETAL: No history of joint pain or swelling.  No myalgias.   SKIN: Negative for lesions, rash, and itching.   HEMATOLOGY/ONCOLOGY: Negative for prolonged bleeding, bruising easily, and swollen nodes.   ENDOCRINE: Negative for cold or heat intolerance, polydipsia or goiter.   PSYCH:  +depression or anxiety symptoms.   NEURO: As Above.   Vital Signs:  BP 116/62 mmHg  Pulse 72  Ht 6\' 1"  (1.854 m)  Wt 186 lb (84.369 kg)  BMI 24.55 kg/m2   General Medical Exam:   General:  Well appearing, comfortable.   Eyes/ENT: see cranial nerve examination.   Neck: No masses appreciated.  Full range of motion without tenderness.  No carotid bruits. Respiratory:  Clear to auscultation, good  air entry bilaterally.   Cardiac:  Regular rate and rhythm, no murmur.   Extremities:  No deformities, edema, or skin discoloration.  Skin:  No rashes or lesions.  Neurological Exam: MENTAL STATUS including orientation to time, place, person, recent and remote memory, attention span and concentration, language, and fund is knowledge isnormal.  Speech is not dysarthric.  CRANIAL NERVES: II:  No visual field defects.  Unremarkable fundi.   III-IV-VI: Pupils equal round and reactive to light.  Normal conjugate, extra-ocular eye movements in all directions of gaze.  No nystagmus.  Subtle right ptosis.   V:  Normal facial sensation.     VII:  Normal facial symmetry and movements.   VIII:  Normal hearing and vestibular function.   IX-X:  Normal palatal movement.   XI:  Normal shoulder shrug and head rotation.   XII:  Normal tongue strength and range of motion, no deviation or fasciculation.  MOTOR:  No atrophy, fasciculations or abnormal movements.  No pronator drift.  Tone is normal.  Straight leg raise is positive on left only.    Right Upper Extremity:    Left Upper Extremity:    Deltoid  5/5   Deltoid  5/5   Biceps  5/5   Biceps  5/5   Triceps  5/5   Triceps  5/5   Wrist extensors  5/5   Wrist extensors  5/5   Wrist flexors  5/5   Wrist flexors  5/5   Finger extensors  5/5   Finger extensors  5/5   Finger flexors  5/5   Finger flexors  5/5   Dorsal interossei  5/5   Dorsal interossei  5/5   Abductor pollicis  5/5   Abductor pollicis  5/5   Tone (Ashworth scale)  0  Tone (Ashworth scale)  0   Right Lower Extremity:    Left Lower Extremity:    Hip flexors  5/5   Hip flexors  5/5   Hip extensors  5/5   Hip extensors  5/5   Knee flexors  5/5   Knee flexors  5/5   Knee extensors  5/5   Knee extensors  5/5   Dorsiflexors  5/5   Dorsiflexors  5/5   Plantarflexors  5/5   Plantarflexors  5/5   Toe extensors  5/5   Toe extensors  5/5   Toe flexors  5/5   Toe flexors  5/5   Tone (Ashworth  scale)  0  Tone (Ashworth scale)  0   MSRs:  Right  Left brachioradialis 2+  brachioradialis 2+  biceps 2+  biceps 2+  triceps 2+  triceps 2+  patellar 2+  patellar 2+  ankle jerk 2+  ankle jerk 2+  Hoffman no  Hoffman no  plantar response down  plantar response down   SENSORY:  Normal and symmetric perception of light touch, pinprick, vibration, and proprioception.  Romberg's sign absent.   COORDINATION/GAIT: Normal finger-to- nose-finger and heel-to-shin.  Intact rapid alternating movements bilaterally.  Able to rise from a chair without using arms.  Gait narrow based and stable. Tandem and stressed gait intact.    IMPRESSION: Mr. Tedd Siasbernathy is a 23 year-old gentleman presenting for evaluation of low back pain and radicular pain of his left leg.  His exam is entirely normal and non-focal.  There is mild reproduction of the leg pain with straight leg raise on the right.  Although by history his symptoms are suggestive of L5-S1 radiculopathy, there is no evidence of nerve impingement on MRI.  I recommend electrodiagnostic testing to better characterize the nature of his symptoms.  If this is non-diagnostic, MRI pelvis can be considered.  Further, he may need to be evaluated by one of our Sport Medicine physicians to be sure a musculoskeletal problem is not being missed.  Medications for symptomatic control was declined by patient.     PLAN/RECOMMENDATIONS:  EMG of the left leg Return to clinic after testing   The duration of this appointment visit was 40 minutes of face-to-face time with the patient.  Greater than 50% of this time was spent in counseling, explanation of diagnosis, planning of further management, and coordination of care.   Thank you for allowing me to participate in patient's care.  If I can answer any additional questions, I would be pleased to do so.    Sincerely,    Donika K. Allena KatzPatel, DO

## 2014-08-31 NOTE — Patient Instructions (Signed)
EMG of the right leg If you decide to cancel the EMG appointment, please call the office. Consider sports medicine referral, if EMG is nondiagnostic

## 2014-09-07 ENCOUNTER — Telehealth: Payer: Self-pay | Admitting: Neurology

## 2014-09-07 NOTE — Telephone Encounter (Signed)
Pt requesting cost of EMG study. Per Dr. Allena KatzPatel, she will use codes [id number] or [phone] depending on the extent of the study and code [id number]. We will bill insurance between 670.00 - 770.00 for this test. [facility] offers a 50% discount for self pay charges so this brings the amount to approx. $335.00 to 385.00. I tried to call pt with this amount but his VM is full. I will try again later / Sherri S.

## 2014-09-07 NOTE — Telephone Encounter (Signed)
Pt called and I informed him of this.

## 2014-09-23 ENCOUNTER — Encounter: Payer: Self-pay | Admitting: Sports Medicine

## 2014-09-23 ENCOUNTER — Ambulatory Visit (INDEPENDENT_AMBULATORY_CARE_PROVIDER_SITE_OTHER): Payer: BC Managed Care – PPO | Admitting: Sports Medicine

## 2014-09-23 VITALS — BP 122/73 | HR 85 | Ht 73.0 in | Wt 185.0 lb

## 2014-09-23 DIAGNOSIS — M792 Neuralgia and neuritis, unspecified: Secondary | ICD-10-CM | POA: Diagnosis not present

## 2014-09-23 DIAGNOSIS — M5432 Sciatica, left side: Secondary | ICD-10-CM | POA: Diagnosis not present

## 2014-09-23 MED ORDER — GABAPENTIN 300 MG PO CAPS: 300.0000 mg | ORAL_CAPSULE | Freq: Every day | ORAL | Status: DC

## 2014-09-23 NOTE — Patient Instructions (Addendum)
1. Do at least 2 stretches on the hand out given. Specifically: 1-stand on right leg and bend leg to 90 degrees and rotate at hips side to side. 15 reps x3 sets. 2- hold lumbells and do forward crossover lunges. Lunge forward with your left foot and cross over in front of your right leg. Do 3 sets of 15. 3- lay on stomach and left up left leg to the point where you are feeling pain. Do not continue if having pain. Goal of 3 sets of 10. 2. Take gabapentin 300mg  nightly. Note: this medicine may make you drowsy or have unusual dreams. If having changes in mood or other behaviors, we may stop this medicine. Call our office if concerns about side effects. 3. Keep a calendar of when having pain and what started the pain, so we can evaluate if stretches or gabapentin help with frequency of pain 4. Return to clinic in 4-6 weeks. 5. Things that could be causing pain: Pyriformis syndrome or nerve entrapment syndromes (sciatic nerve)

## 2014-09-23 NOTE — Assessment & Plan Note (Signed)
See OV  Likely related to nerve entrapment  Doubt this would show with NCV as the sxs are intermittent

## 2014-09-23 NOTE — Progress Notes (Signed)
Subjective:    Patient ID: Guy Walton, male    DOB: 1990/10/10, 24 y.o.   MRN: [id number]  HPI  [name] is a 24 yo male who presents with left lower back, hip, groin and leg pain that radiates down to his toes. The pain is described as first having started when he was 24 years old. At that time, he was playing in a basketball game and fell onto the knee of one of the other players with his left lower back. Since that time he has continued to have pain off and on, but it has really seemed to flare up in the last couple of years with his work. He describes the pain as being a shooting pain at times, and as being dull and achy at other times. 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 He specifically cites bending over, heavy lifting, and sitting for long periods of time as being aggravating activities. The pain is described as occurring at night, waking him from sleep. The pain improves when he has several days in a row off from work, as well as after doing a variety of different stretches. He has tried NSAIDs and cites minimal relief. He does not have any weakness or instability. He has had no fevers, chills, changes in appetite or weight loss. In the past he has been seen by a neurologist, Dr. Nita Sickle of [facility] and has had radiographs as well as an MRI. The MRI and radiographs were reportedly normal. Based on his history it was thought that he may have an L5-S1 radiculopathy, but his neurological exam was not consistent with these findings. He was encouraged to follow-up with Sports Medicine for further evaluation of an MSK related etiology.     Review of Systems Negative other than documented in HPI.      Objective:   Physical Exam General: well-appearing, pleasant male in no acute distress Vitals: BP: 122/73  P: 85  Back:  Inspection: able to sit comfortably throughout exam and interview, vertebral  spinal processes are midline, no asymmetry  Palpation: no tenderness to palpation of vertebral spinal process or paraspinal muscles ROM: full, painless ROM with flexion, extension, lateral flexing, and right and left rotation Special Testing: negative straight leg raise, negative Stork test  Neurovascular:  Neurovascularly intact distally  Right Hip:  Inspection: no asymmetry Palpation: no tenderness to palpation over SI joint ROM: full ROM with flexion, extension, abduction, adduction, internal and external rotation Strength: 5/5 with flexion, extension, abduction, adduction Special Testing: -FABER's, - FADIR Neurovascular: Patellar reflex 2+, Achilles reflex 2+, sensation grossly intact bilaterally in lower extremities, heel and toe walk normal, 2+dorsalis pedis pulse  Left Hip:  Inspection: no asymmetry Palpation: tenderness to palpation over piriformis muscle, no tenderness over SI joint ROM: full ROM with flexion, extension, abduction, adduction, internal and external rotation Strength: 5/5 with flexion, extension, abduction, adduction  Special Testing: +FABER's, - FADIR Neurovascular: Patellar reflex 2+, Achilles reflex 2+, sensation grossly intact bilaterally in lower extremities, heel and toe walk normal, 2+dorsalis pedis pulse  ER of Left hip refers pain into leg and into groin even to testicle     Assessment & Plan:   1. Left back, hip, groin, and leg pain  Based on [name]'s history of this pain really initiating after injuring his back playing basketball at 24 years old, negative radiographs and MRI, the differential consists Piriformis Syndrome  vs Obturator nerve entrapment. The description of the pain as "shooting" and the presence of the pain at night further supports a neurological origin. 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 On examination, his  positive FABER's test, with pain specifically initiated with external rotation, further support this diagnosis. It is unclear whether the Piriformis Syndrome developed secondary to scarring and fibrosis of the muscle surrounding the Sciatic nerve following the previous basketball injury, or whether Mr. Biancardi's sciatic nerve takes an unusual path through a bifid Piriformis muscle as part of his anatomy. Obturator nerve entrapment may also be a consideration given the radiation of the pain toward his medial thigh and groin region, but less likely.    - Provided pt with handout on stretches to help relieve pain, with plan to select 2 off of sheet that are helpful and not painful - Encouraged patient to keep journal of days when pain is worse, noting activities of day and stretches performed - Will start gabapentin 300mg  nightly to help with pain - Follow-up in 4-6 weeks    This patient was seen with and note was dictated by Gorden HarmsMegan Campbell, MS4 [facility]   Excellent summary of our evaluation and plan.   Sterling BigKB Kamdyn Colborn, MD

## 2014-10-04 ENCOUNTER — Telehealth: Payer: Self-pay | Admitting: Neurology

## 2014-10-04 NOTE — Telephone Encounter (Signed)
Pt canceled his EMG for 10/07/14. He did not give a reason.

## 2014-10-06 ENCOUNTER — Ambulatory Visit (INDEPENDENT_AMBULATORY_CARE_PROVIDER_SITE_OTHER): Payer: BC Managed Care – PPO | Admitting: Physician Assistant

## 2014-10-06 VITALS — BP 122/76 | HR 83 | Temp 99.2°F | Resp 17 | Ht 73.5 in | Wt 179.0 lb

## 2014-10-06 DIAGNOSIS — J101 Influenza due to other identified influenza virus with other respiratory manifestations: Secondary | ICD-10-CM

## 2014-10-06 LAB — POCT INFLUENZA A/B
INFLUENZA B, POC: POSITIVE
Influenza A, POC: NEGATIVE

## 2014-10-06 MED ORDER — OSELTAMIVIR PHOSPHATE 75 MG PO CAPS: 75.0000 mg | ORAL_CAPSULE | Freq: Two times a day (BID) | ORAL | Status: DC

## 2014-10-06 MED ORDER — IBUPROFEN 600 MG PO TABS: 600.0000 mg | ORAL_TABLET | Freq: Three times a day (TID) | ORAL | Status: DC | PRN

## 2014-10-06 MED ORDER — HYDROCODONE-HOMATROPINE 5-1.5 MG/5ML PO SYRP: 5.0000 mL | ORAL_SOLUTION | Freq: Three times a day (TID) | ORAL | Status: DC | PRN

## 2014-10-06 NOTE — Progress Notes (Signed)
IDENTIFYING INFORMATION  Sheddrick Lattanzio / DOB: [birth year] / MRN: [id number]  The patient has Left sided sciatica on his problem list.  SUBJECTIVE  CC: Nasal Congestion; Sore Throat; and Chills   HPI: Mr. Killgore is a 24 y.o. ill appearing male presenting for sore throat that hurts when he swallow (5/10) that started last night. He also reports some chills, muscle aches, cough and mild nasal congestion that also started yesterday. 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  Of the above symptoms he mostly complains of "felling run down."  He has tried Advil 400 mg po at at 7:30 which helped his sore throat. He has not had the seasonal flu shot. He denies recent travel.   He  has a past medical history of ADHD (attention deficit hyperactivity disorder); Tourette's; and Chronic low back pain.    He has a current medication list which includes the following prescription(s): gabapentin, hydrocodone-homatropine, ibuprofen, and oseltamivir.  Mr. Marchio has No Known Allergies. He  reports that he has never smoked. He has never used smokeless tobacco. He reports that he does not drink alcohol or use illicit drugs. He  reports that he currently engages in sexual activity and has had male partners.  The patient  has past surgical history that includes No past surgeries.  His family history includes Healthy in his brother and sister; Hypercholesterolemia in his paternal grandfather; Hypertension in his paternal grandfather and paternal grandmother; Hypothyroidism in his paternal grandmother.  Review of Systems  Constitutional: Positive for fever, chills, weight loss, malaise/fatigue and diaphoresis.  Respiratory: Positive for cough and sputum production. Negative for shortness of breath and wheezing.   Cardiovascular: Negative for chest pain and palpitations.  Gastrointestinal: Negative for nausea.    Genitourinary: Negative.   Musculoskeletal: Positive for myalgias. Negative for neck pain.  Skin: Negative for itching and rash.  Neurological: Positive for headaches. Negative for dizziness.    OBJECTIVE  Blood pressure 122/76, pulse 83, temperature 99.2 F (37.3 C), temperature source Oral, resp. rate 17, height 6' 1.5" (1.867 m), weight 179 lb (81.194 kg), SpO2 99 %. The patient's body mass index is 23.29 kg/(m^2).  Physical Exam  Constitutional: He is oriented to person, place, and time. He appears well-developed and well-nourished. He does not appear ill. No distress.  HENT:  Right Ear: Hearing and tympanic membrane normal.  Left Ear: Hearing and tympanic membrane normal.  Nose: No mucosal edema. Right sinus exhibits no maxillary sinus tenderness and no frontal sinus tenderness. Left sinus exhibits no maxillary sinus tenderness and no frontal sinus tenderness.  Mouth/Throat: Uvula is midline and mucous membranes are normal. Posterior oropharyngeal erythema present. No oropharyngeal exudate, posterior oropharyngeal edema or tonsillar abscesses.  Cardiovascular: Normal rate, regular rhythm and normal heart sounds.   Respiratory: Effort normal and breath sounds normal. No respiratory distress. He has no rales.  GI: He exhibits no distension.  Musculoskeletal: Normal range of motion.  Lymphadenopathy:       Head (right side): No submental, no submandibular, no tonsillar, no preauricular, no posterior auricular and no occipital adenopathy present.       Head (left side): No submental, no submandibular, no tonsillar, no preauricular, no posterior auricular and no occipital adenopathy present.    He has no cervical adenopathy.  Neurological: He is alert and oriented to person, place, and time. No cranial nerve deficit.  Skin: He is not  diaphoretic. There is pallor.  Psychiatric: He has a normal mood and affect. His behavior is normal. Judgment and thought content normal.    Results for  orders placed or performed in visit on 10/06/14 (from the past 24 hour(s))  POCT Influenza A/B     Status: None   Collection Time: 10/06/14 11:07 AM  Result Value Ref Range   Influenza A, POC Negative    Influenza B, POC Positive     ASSESSMENT & PLAN  Reuel BoomDaniel was seen today for nasal congestion, sore throat and chills.  Diagnoses and associated orders for this visit:  Influenza B: Concern for flu with rapid onset of multiple system symptomology, particularly myalgia, malaise, and cough.  His low grade fever has persisted despite 400 mg of ibuprofen at 7:30 this morning.   - POCT Influenza A/B (Positive B) -     Tamiflu 75 mg bid, first dose today.  -     Ibuprofen 600 mg po tid -     Hycodan 5 mL po q8     The patient was instructed to to call or comeback to clinic as needed, or should symptoms warrant.  Deliah BostonMichael Dominik Lauricella, MHS, PA-C [facility] 10/06/2014 11:17 AM

## 2014-10-07 ENCOUNTER — Encounter: Payer: BC Managed Care – PPO | Admitting: Neurology

## 2014-10-10 ENCOUNTER — Ambulatory Visit (INDEPENDENT_AMBULATORY_CARE_PROVIDER_SITE_OTHER): Payer: BC Managed Care – PPO

## 2014-10-10 ENCOUNTER — Ambulatory Visit (INDEPENDENT_AMBULATORY_CARE_PROVIDER_SITE_OTHER): Payer: BC Managed Care – PPO | Admitting: Emergency Medicine

## 2014-10-10 VITALS — BP 124/74 | HR 83 | Temp 98.3°F | Resp 16 | Ht 73.5 in | Wt 179.0 lb

## 2014-10-10 DIAGNOSIS — R05 Cough: Secondary | ICD-10-CM

## 2014-10-10 DIAGNOSIS — R059 Cough, unspecified: Secondary | ICD-10-CM

## 2014-10-10 DIAGNOSIS — J101 Influenza due to other identified influenza virus with other respiratory manifestations: Secondary | ICD-10-CM

## 2014-10-10 LAB — POCT RAPID STREP A (OFFICE): Rapid Strep A Screen: NEGATIVE

## 2014-10-10 NOTE — Progress Notes (Addendum)
   Subjective:    Patient ID: Guy Walton, male    DOB: 1991/04/03, 24 y.o.   MRN: [id number]  This chart was scribed for Lucilla EdinSteve A Lailee Hoelzel, MD by Ronney LionSuzanne Le, ED Scribe. This patient was seen in room 8 and the patient's care was started at 1:26 PM.   Cough Associated symptoms include a sore throat. Pertinent negatives include no fever or wheezing.  Sore Throat  Associated symptoms include congestion and coughing.    HPI Comments: Guy FiedlerDaniel Giuliano is a 24 y.o. male who [facility]-up after being seen for flu-symptoms about 4 days ago, and being placed on Tamiflu. Today, he complains of dry cough, congestion, and sore throat. 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  He denies fever and wheezing.  Review of Systems  Constitutional: Negative for fever.  HENT: Positive for congestion and sore throat.   Respiratory: Positive for cough. Negative for wheezing.        Objective:   Physical Exam  Nursing note and vitals reviewed.  CONSTITUTIONAL: Well developed/well nourished HEAD: Normocephalic/atraumatic EYES: EOMI/PERRL ENMT: Mucous membranes moist NECK: supple no meningeal signs SPINE/BACK:entire spine nontender CV: S1/S2 noted, no murmurs/rubs/gallops noted LUNGS: Lungs are clear to auscultation bilaterally, no apparent distress ABDOMEN: soft, nontender, no rebound or guarding, bowel sounds noted throughout abdomen GU:no cva tenderness NEURO: Pt is awake/alert/appropriate, moves all extremitiesx4.  No facial droop.   EXTREMITIES: pulses normal/equal, full ROM SKIN: warm, color normal PSYCH: no abnormalities of mood noted, alert and oriented to situation  [facility] reading (PRIMARY) by  Dr. Cleta Albertsaub there is mild increase in the interstitial markings. There are no consolidated infiltrates. Results  for orders placed or performed in visit on 10/10/14  POCT rapid strep A  Result Value Ref Range   Rapid Strep A Screen Negative Negative       Assessment & Plan:  No definite pneumonia on x-ray. I've taken him out of work. He needs to rest up and recover from his illness..I personally performed the services described in this documentation, which was scribed in my presence. The recorded information has been reviewed and is accurate.

## 2014-10-11 ENCOUNTER — Telehealth: Payer: Self-pay

## 2014-10-11 NOTE — Telephone Encounter (Signed)
Pt has been seen here 2-3 times for uri,he has developed runny eyes,and discharge and would like an eye drop called in rather than being seen again   Best phone for pt is [phone]   Pharmacy [facility]

## 2014-10-12 MED ORDER — OFLOXACIN 0.3 % OP SOLN: 1.0000 [drp] | Freq: Four times a day (QID) | OPHTHALMIC | Status: DC

## 2014-10-12 NOTE — Telephone Encounter (Signed)
Okay to call in Ocuflox 1 drop in each eye 4 times a day. Also given instructions for treatment of conjunctivitis.

## 2014-10-12 NOTE — Telephone Encounter (Signed)
Pt called back and states he would like his medication [facility] [place]. Sent Rx in.

## 2014-10-12 NOTE — Telephone Encounter (Signed)
I called pt but voicemail is full and unable to leave messages. I will call back later.

## 2014-11-02 ENCOUNTER — Ambulatory Visit (INDEPENDENT_AMBULATORY_CARE_PROVIDER_SITE_OTHER): Payer: BC Managed Care – PPO | Admitting: Sports Medicine

## 2014-11-02 ENCOUNTER — Encounter: Payer: Self-pay | Admitting: Sports Medicine

## 2014-11-02 VITALS — BP 106/69 | HR 70 | Ht 73.0 in | Wt 185.0 lb

## 2014-11-02 DIAGNOSIS — M792 Neuralgia and neuritis, unspecified: Secondary | ICD-10-CM

## 2014-11-02 DIAGNOSIS — M5432 Sciatica, left side: Secondary | ICD-10-CM | POA: Diagnosis not present

## 2014-11-02 MED ORDER — GABAPENTIN 300 MG PO CAPS: 600.0000 mg | ORAL_CAPSULE | Freq: Every day | ORAL | Status: DC

## 2014-11-02 NOTE — Patient Instructions (Addendum)
Continue the exercises for your piriformis syndrome. Add the crossover stretch we discussed if does not bother you (2 sets of 5, holding for count of 3). Do not do exercises that hurt. Continue gabapentin 300mg  nightly, and if very painful try an extra pill nightly. Follow up in 2 months.  Best.

## 2014-11-02 NOTE — Progress Notes (Signed)
Subjective:   CC: Left hip, groin, back pain  HPI:   Pt last visit was diagnosed with piriformis syndrome causing sciatic nerve entrapment with sciatica symptoms. MRI and xrays were reportedly negative. Has been taking gabapentin 300mg  QHS and doing stretching exercises which he states have helped a lot, with exception of crossover lunges which caused worse pain.  Would like to do p90x with a friend.  Review of Systems - Per HPI.  PMH - L sciatica    Objective:  Physical Exam BP 106/69 mmHg  Pulse 70  Ht 6\' 1"  (1.854 m)  Wt 185 lb (83.915 kg)  BMI 24.41 kg/m2 GEN: NAD  BACK: Full back flexion, extension, lateral flexion, and bilateral rotation with no pain Negative straight leg raise left  Hips: Left internal rotation with moderate reproduced pain/ particularly in positions that stretch the piriformis 5/5 left hip flexion with reproduced pain Pain with left FABER Bilateral external rotation 30 degrees, internal rotation 20 degrees Excellent hip abduction and rotation strength  Note x-leg stretch with hip flexion recreates the pain directly over the pirifromis and if he holds the stretch pain will refer to the Left leg  Normal, comfortable seated position and gait    Assessment:     Guy Walton is a 24 y.o. male here for f/u low back pain with hip and groin pain.    Plan:     # See problem list and after visit summary for problem-specific plans.   Follow-up: Follow up in 2 months for piriformis syndrome.    Guy SingletonMaria T Naomia Lenderman, MD   Edited and agree with assessment [facility]

## 2014-11-02 NOTE — Assessment & Plan Note (Addendum)
Sciatic sxs much less frequent He has kept a diary Seems 2/2 piriformis tightness and strain  Piriformis syndrome improving but still bothersome with pain reproduced on exam with internal rotation and FABER. Sciatica likely caused by nerve entrapment. Neg straight leg raise. - Continue exercises. Add crossover stretch. Limit exercises by pain. - Continue gabapentin 300mg  nightly. Can increase to 600mg  if severe pain. (note he gets nightmares with gabapentin) - Follow up in 2 months. - When doing p90x, watch any exercises that put hip in strange position or cause pain.

## 2015-02-15 ENCOUNTER — Encounter: Payer: Self-pay | Admitting: Sports Medicine

## 2015-03-17 ENCOUNTER — Ambulatory Visit: Payer: BC Managed Care – PPO | Admitting: Sports Medicine

## 2015-03-30 ENCOUNTER — Ambulatory Visit: Payer: BC Managed Care – PPO | Admitting: Sports Medicine

## 2015-05-30 ENCOUNTER — Other Ambulatory Visit: Payer: Self-pay | Admitting: *Deleted

## 2015-05-30 DIAGNOSIS — M792 Neuralgia and neuritis, unspecified: Secondary | ICD-10-CM

## 2015-05-30 MED ORDER — GABAPENTIN 300 MG PO CAPS: 600.0000 mg | ORAL_CAPSULE | Freq: Every day | ORAL | Status: DC

## 2015-08-21 IMAGING — CR DG CHEST 2V
3 series · 3 of 3 positions shown · non-contrast
Comparison: 01/12/2013

CLINICAL DATA: Cough

EXAM:
CHEST  2 VIEW

[PA (1 of 2)]
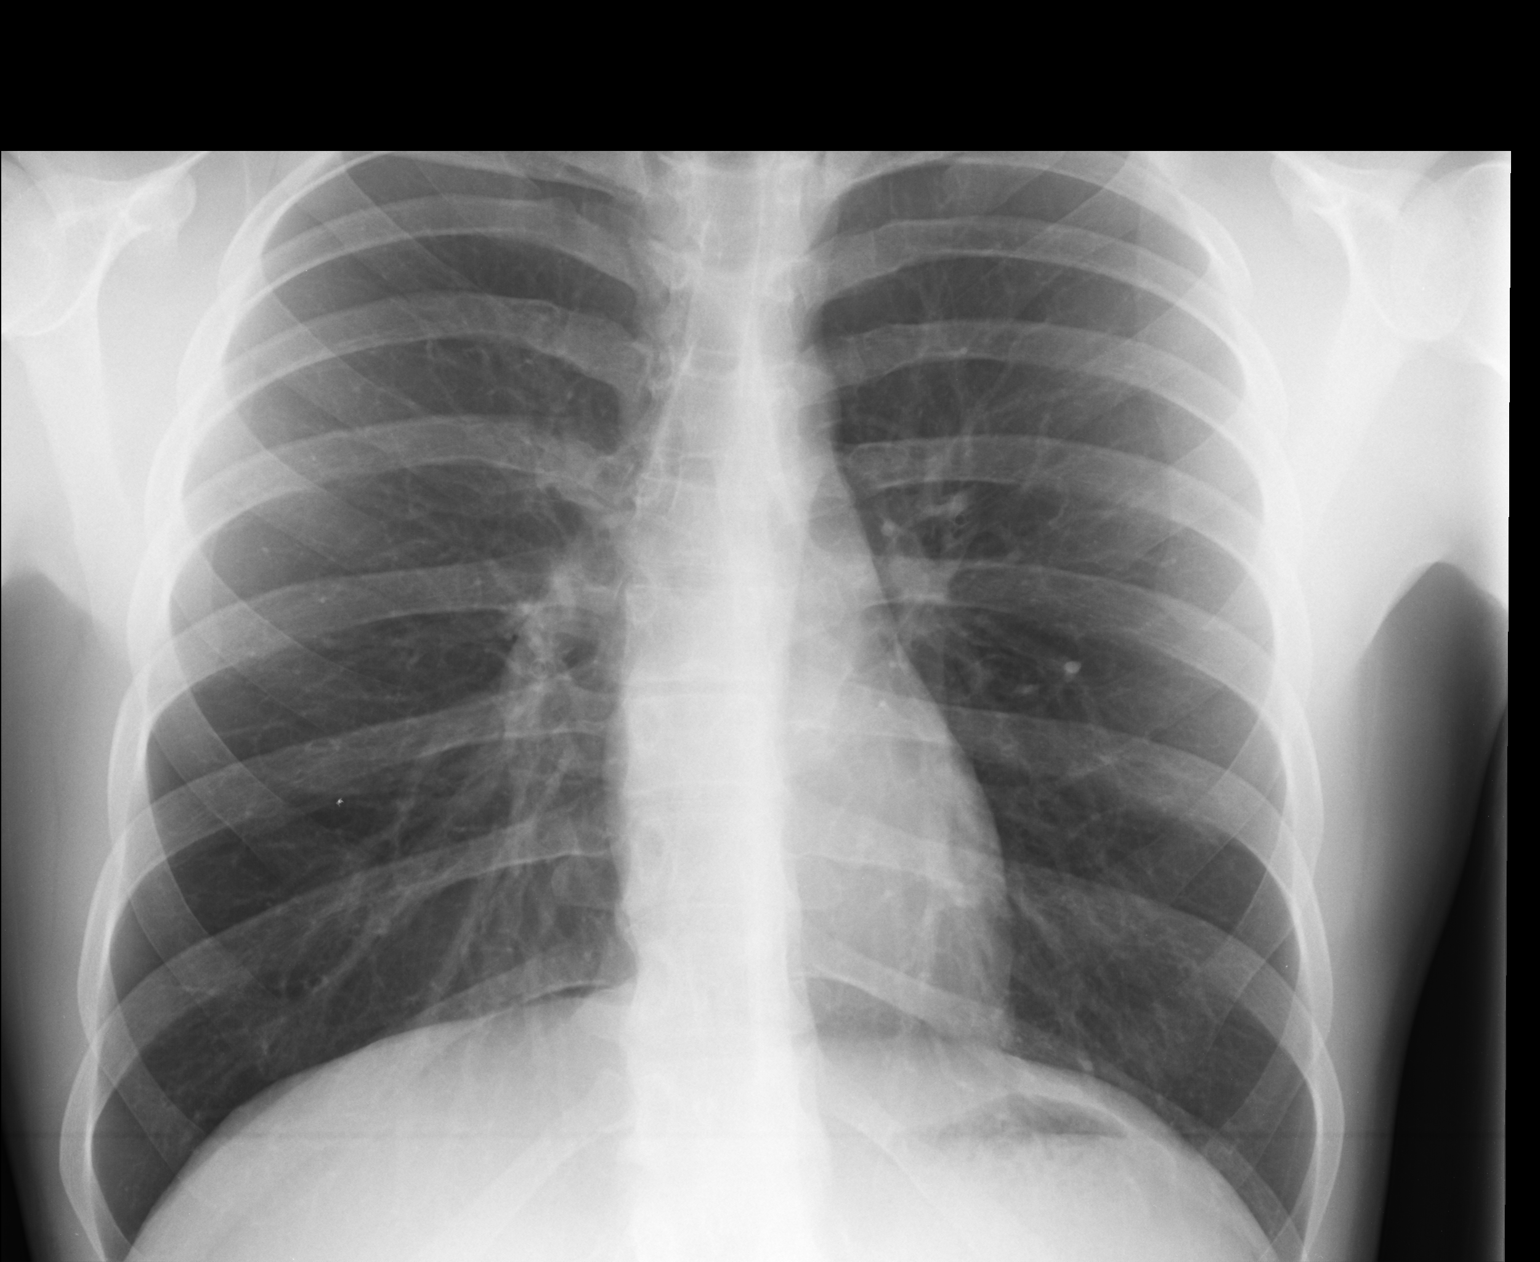

[lateral]
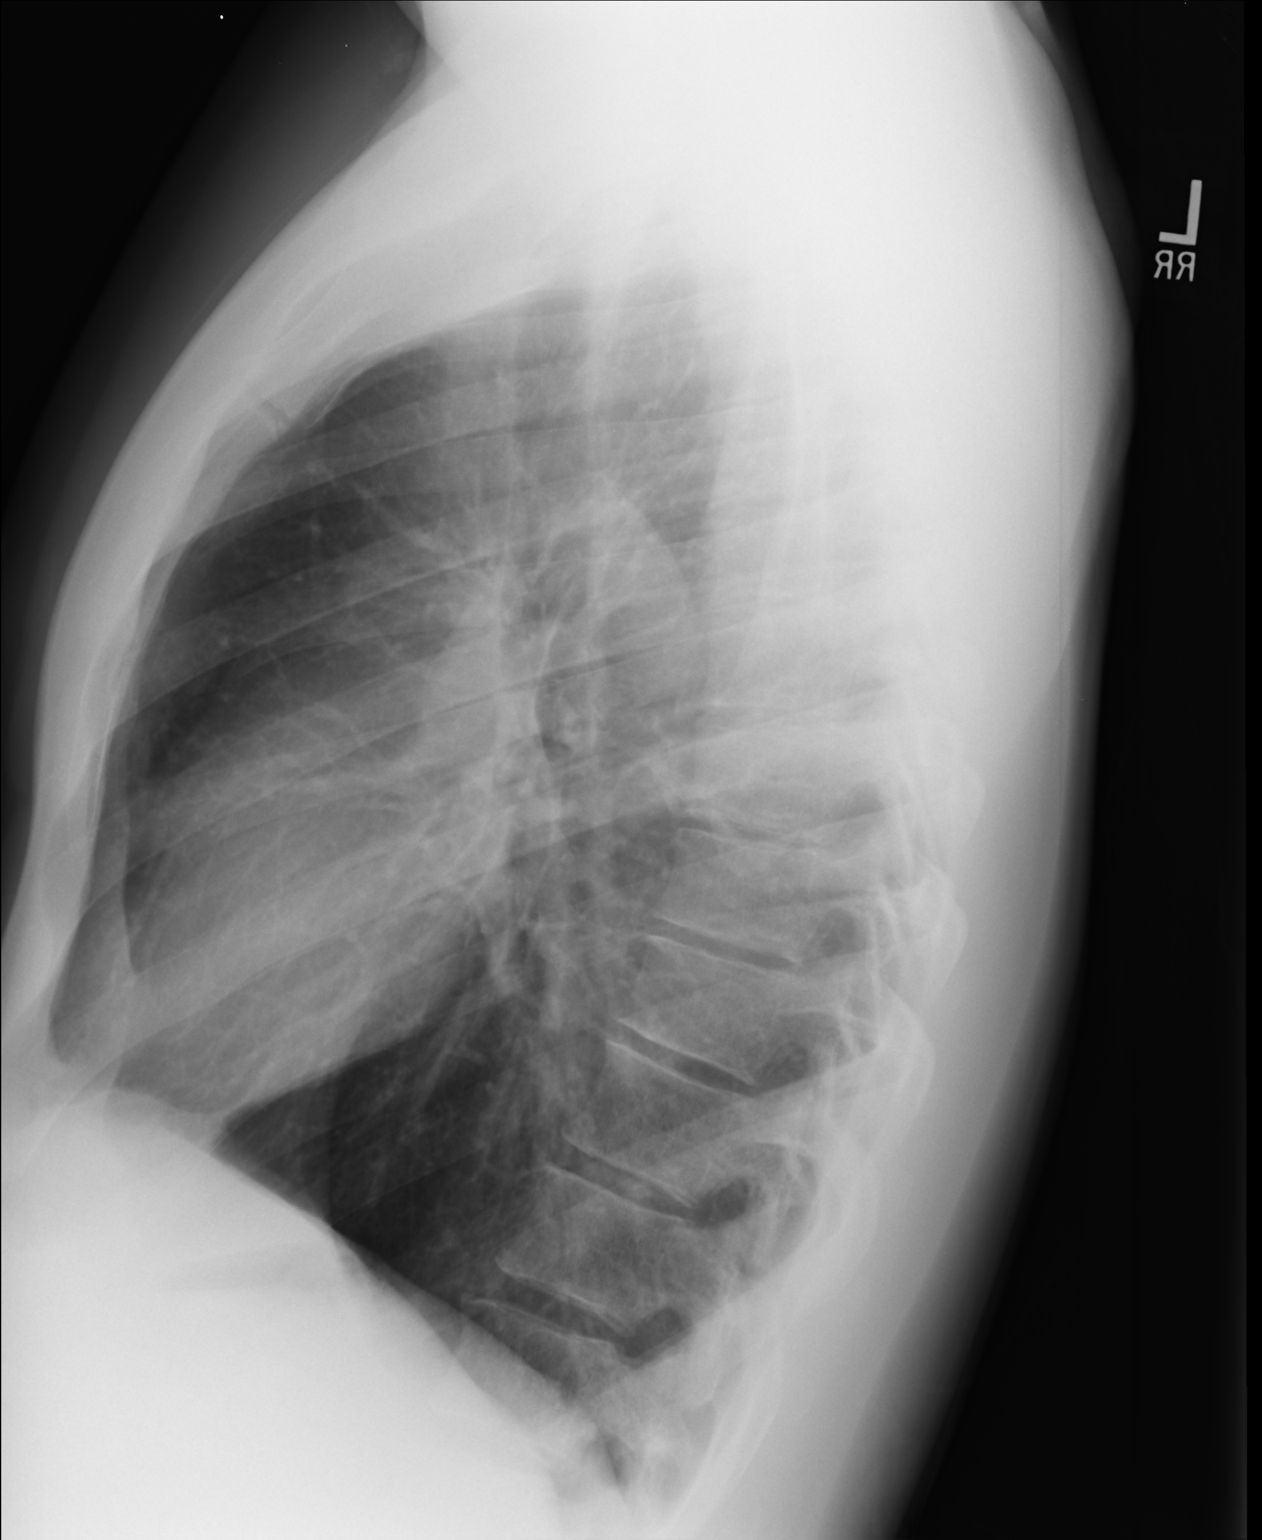

[PA (2 of 2)]
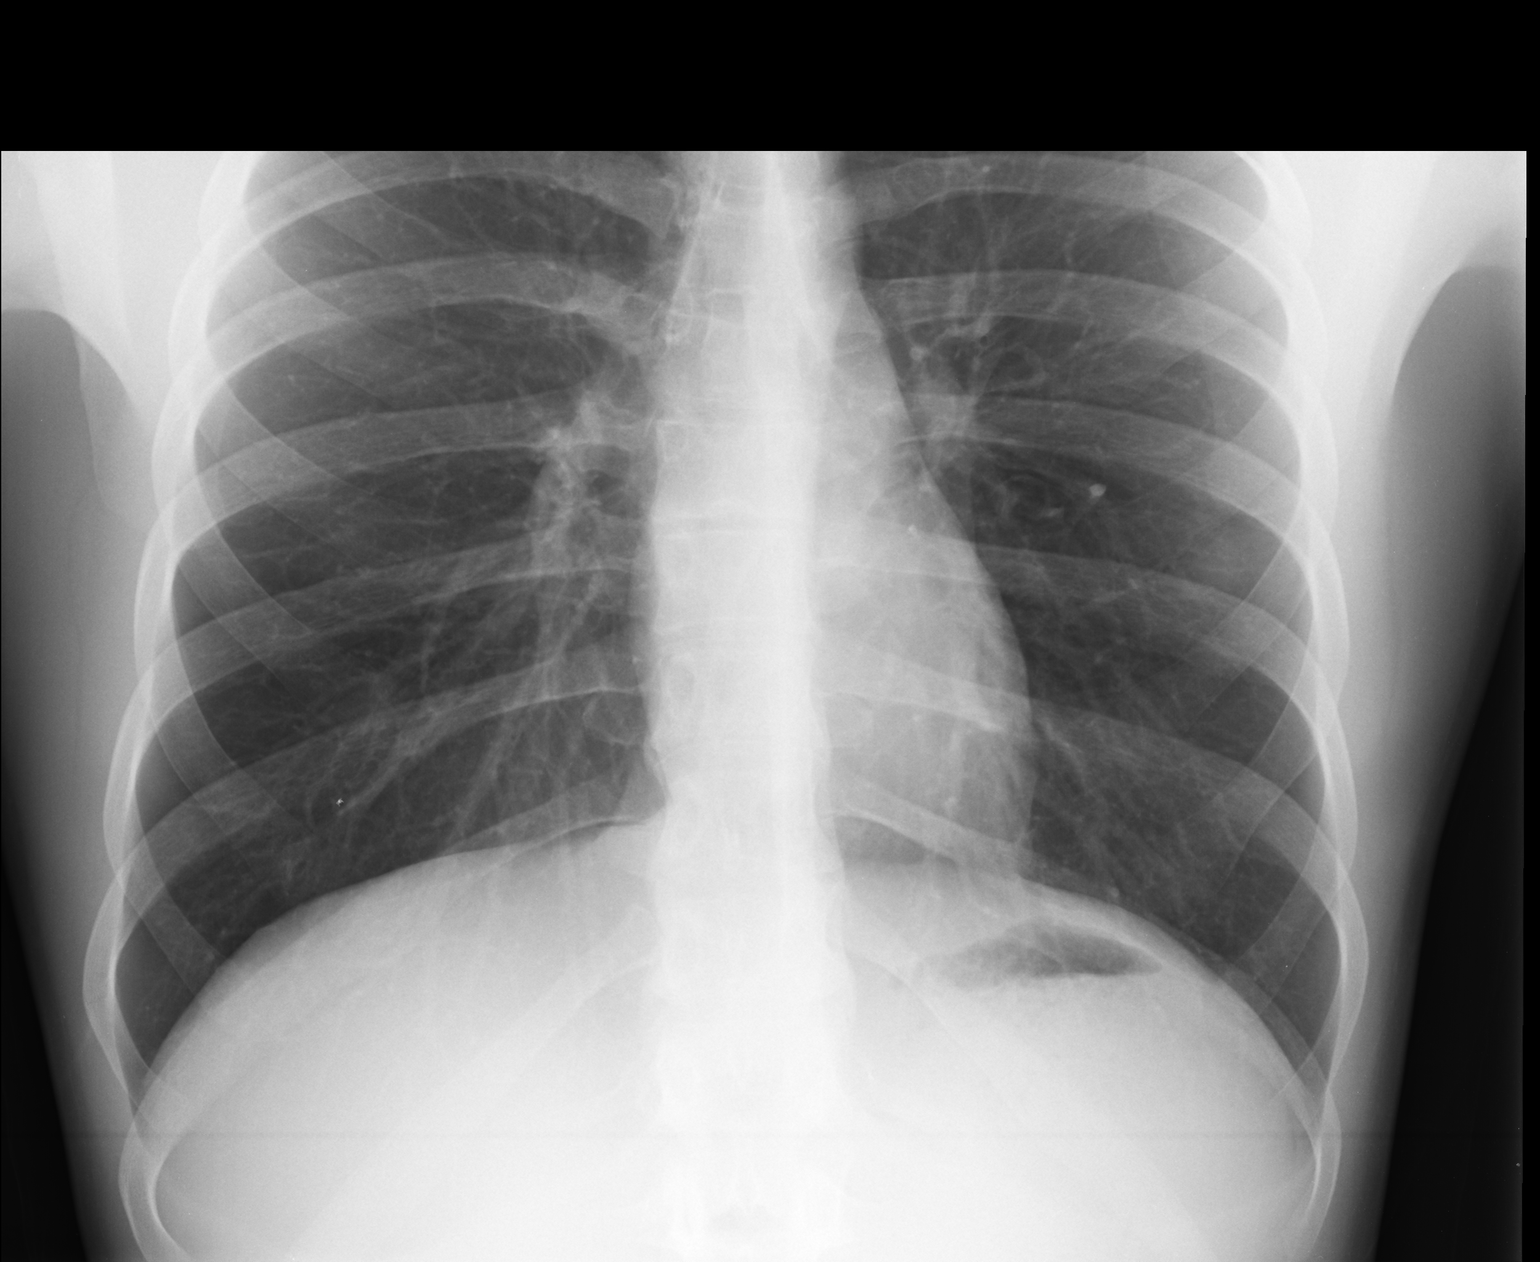

[3 of 3 positions shown; findings below may reference images not displayed]

FINDINGS: The cardiac shadow is within normal limits. The lungs are clear
bilaterally. Hyperinflation is noted due to a vigorous inspiration.
No bony abnormality is seen.
IMPRESSION: No active cardiopulmonary disease.

## 2015-12-19 ENCOUNTER — Ambulatory Visit (INDEPENDENT_AMBULATORY_CARE_PROVIDER_SITE_OTHER): Payer: BC Managed Care – PPO | Admitting: Family Medicine

## 2015-12-19 VITALS — BP 126/76 | HR 75 | Temp 98.3°F | Resp 17 | Ht 75.0 in | Wt 185.0 lb

## 2015-12-19 DIAGNOSIS — R6889 Other general symptoms and signs: Secondary | ICD-10-CM

## 2015-12-19 LAB — POCT INFLUENZA A/B
Influenza A, POC: NEGATIVE
Influenza B, POC: NEGATIVE

## 2015-12-19 NOTE — Progress Notes (Signed)
Patient ID: Anastasia FiedlerDaniel Syverson, male    DOB: 11/03/1990  Age: 25 y.o. MRN: [id number]  Chief Complaint  Patient presents with  . Cough  . Sore Throat    Subjective:   Patient is here with a respiratory problem. Started with sore throat. Now has had congestion and cough. No fever. 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  Otherwise is healthy.  Current allergies, medications, problem list, past/family and social histories reviewed.  Objective:  BP 126/76 mmHg  Pulse 75  Temp(Src) 98.3 F (36.8 C) (Oral)  Resp 17  Ht 6\' 3"  (1.905 m)  Wt 185 lb (83.915 kg)  BMI 23.12 kg/m2  SpO2 98%  No major acute distress. TMs normal. Nose congested. Throat clear with only minimal erythema. Neck supple without nodes. Chest clear to auscultation. Heart regular without murmurs.  Assessment & Plan:   Assessment: 1. Flu-like symptoms       Plan: We'll check a flu swab.  Orders Placed This Encounter  Procedures  . POCT Influenza A/B    No orders of the defined types were placed in this encounter.    Results for orders placed or performed in visit on 12/19/15  POCT Influenza A/B  Result Value Ref Range   Influenza A, POC Negative Negative   Influenza B, POC Negative Negative        Patient Instructions    Drink plenty of fluids and get enough rest  Take ibuprofen 200 mg 3 or 4 pills 3 times daily if needed for aching or fever. All you can take Tylenol (acetaminophen) 500 mg 2 tablets 3 times daily.  Take one of the DM-containing cough syrups over-the-counter if the cough is getting worse. If you find you need a stronger prescription cough medicine call back  If the head gets more congested take Claritin-D or Allegra-D or Zyrtec-D(loratadine, fexofenadine, or cetirizine) as directed on the container  Return if worse  You probably or infectious to other people, so stay out of your girlfriends face as much possible..     IF you  received an x-ray today, you will receive an invoice from [facility]. Please contact [facility] at [phone] with questions or concerns regarding your invoice.   IF you received labwork today, you will receive an invoice from [facility]. Please contact [facility] at [phone] with questions or concerns regarding your invoice.   Our billing staff will not be able to assist you with questions regarding bills from these companies.  You will be contacted with the lab results as soon as they are available. The fastest way to get your results is to activate your My Chart account. Instructions are located on the last page of this paperwork. If you have not heard from us regarding the results in 2 weeks, please contact this office.          Return if symptoms worsen or fail to improve.   Jaris Kohles, MD 12/19/2015

## 2015-12-19 NOTE — Patient Instructions (Addendum)
  Drink plenty of fluids and get enough rest  Take ibuprofen 200 mg 3 or 4 pills 3 times daily if needed for aching or fever. All you can take Tylenol (acetaminophen) 500 mg 2 tablets 3 times daily.  Take one of the DM-containing cough syrups over-the-counter if the cough is getting worse. If you find you need a stronger prescription cough medicine call back  If the head gets more congested take Claritin-D or Allegra-D or Zyrtec-D(loratadine, fexofenadine, or cetirizine) as directed on the container  Return if worse  You probably or infectious to other people, so stay out of your girlfriends face as much possible..     IF you received an x-ray today, you will receive an invoice from [facility]. Please contact [facility] at [phone] with questions or concerns regarding your invoice.   IF you received labwork today, you will receive an invoice from [facility]. Please contact [facility] at [phone] with questions or concerns regarding your invoice.   Our billing staff will not be able to assist you with questions regarding bills from these companies.  You will be contacted with the lab results as soon as they are available. The fastest way to get your results is to activate your My Chart account. Instructions are located on the last page of this paperwork. If you have not heard from us regarding the results in 2 weeks, please contact this office.

## 2016-01-31 ENCOUNTER — Other Ambulatory Visit: Payer: Self-pay | Admitting: *Deleted

## 2016-01-31 DIAGNOSIS — M792 Neuralgia and neuritis, unspecified: Secondary | ICD-10-CM

## 2016-01-31 MED ORDER — GABAPENTIN 300 MG PO CAPS: ORAL_CAPSULE | ORAL | Status: DC

## 2016-02-08 ENCOUNTER — Ambulatory Visit: Payer: BC Managed Care – PPO | Admitting: Sports Medicine

## 2016-02-17 ENCOUNTER — Ambulatory Visit: Payer: BC Managed Care – PPO | Admitting: Family Medicine

## 2016-02-28 ENCOUNTER — Ambulatory Visit (INDEPENDENT_AMBULATORY_CARE_PROVIDER_SITE_OTHER): Payer: BC Managed Care – PPO | Admitting: Family Medicine

## 2016-02-28 ENCOUNTER — Encounter: Payer: Self-pay | Admitting: Family Medicine

## 2016-02-28 VITALS — BP 122/64 | Ht 73.0 in | Wt 185.0 lb

## 2016-02-28 DIAGNOSIS — M792 Neuralgia and neuritis, unspecified: Secondary | ICD-10-CM | POA: Diagnosis not present

## 2016-02-28 MED ORDER — CYCLOBENZAPRINE HCL 10 MG PO TABS: 10.0000 mg | ORAL_TABLET | Freq: Every evening | ORAL | Status: DC | PRN

## 2016-02-28 MED ORDER — GABAPENTIN 300 MG PO CAPS: ORAL_CAPSULE | ORAL | Status: AC

## 2016-03-02 ENCOUNTER — Encounter: Payer: Self-pay | Admitting: Family Medicine

## 2016-03-02 NOTE — Progress Notes (Signed)
Subjective:   CC: Left hip, groin, back pain  HPI:   Pt last visit was diagnosed with piriformis syndrome causing sciatic nerve entrapment with sciatica symptomsIn the past. He was last seen in February 2016. MRI and xrays were reportedly negative. He had been taking gabapentin 300 mg daily at bedtime until he ran out. This had been helping significantly. He did get it refilled 1 week ago and is starting to help. Overall he feels his symptoms are 85% improved with medications and home exercises. He wants to get back in the the gym and start doing P90 X. Would like to do p90x with a friend. He denies any bowel or bladder dysfunction. He denies any lower extremity weakness.  Review of Systems - Per HPI.  PMH - L sciatica    Objective:  Physical Exam BP 122/64 mmHg  Ht 6\' 1"  (1.854 m)  Wt 185 lb (83.915 kg)  BMI 24.41 kg/m2 GEN: NAD  BACK: Full back flexion, extension, lateral flexion, and bilateral rotation with no pain Negative straight leg raise left  Hips: Left internal rotation with moderate reproduced pain/ particularly in positions that stretch the piriformis 5/5 left hip flexion with reproduced pain Mild pain with left-sided Faber Normal hip internal and external rotation.  Mild pain with tenderness over the gluteus medius which is exacerbated with hip abduction.  Normal, comfortable seated position and gait    Assessment:     Anastasia FiedlerDaniel Prather is a 25 y.o. male here for f/u low back pain with hip and groin pain.    Plan:     Piriformis syndrome on the left, chronic: Very well controlled with home exercise plan and gabapentin. It was exacerbated when he discontinued his medication. He wants to get back to P90 X. He was prescribed Flexeril by another physician in the past which seemed to help on an as-needed basis. We refilled his Flexeril and gave him a 90 day supply of gabapentin. He will resume his home exercise plan for piriformis syndrome. Call with any questions or  concerns and continue to follow up on a regular basis.     Guinevere ScarletBlake Alayshia Marini, MD

## 2016-03-12 ENCOUNTER — Ambulatory Visit (INDEPENDENT_AMBULATORY_CARE_PROVIDER_SITE_OTHER): Payer: BC Managed Care – PPO | Admitting: Physician Assistant

## 2016-03-12 VITALS — BP 117/75 | HR 86 | Temp 98.4°F | Resp 16 | Ht 73.0 in | Wt 196.2 lb

## 2016-03-12 DIAGNOSIS — B9789 Other viral agents as the cause of diseases classified elsewhere: Principal | ICD-10-CM

## 2016-03-12 DIAGNOSIS — J069 Acute upper respiratory infection, unspecified: Secondary | ICD-10-CM | POA: Diagnosis not present

## 2016-03-12 DIAGNOSIS — R070 Pain in throat: Secondary | ICD-10-CM

## 2016-03-12 LAB — POCT RAPID STREP A (OFFICE): RAPID STREP A SCREEN: NEGATIVE

## 2016-03-12 MED ORDER — IPRATROPIUM BROMIDE 0.03 % NA SOLN: 2.0000 | Freq: Two times a day (BID) | NASAL | Status: DC

## 2016-03-12 NOTE — Progress Notes (Signed)
[facility] [address], [place] [address] [phone]  Date:  03/12/2016   Name:  Guy FiedlerDaniel Walton   DOB:  1991-04-27   MRN:  [id number]  PCP:  Sissy HoffSWAYNE,DAVID W, MD    History of Present Illness:  Guy Walton is a 25 y.o. male patient who [facility] cc of sore throat.   2 days ago, he started to develop a sore throat.  He had recalled being in a dusty room and a fan on and feeling very cold.  He has no hx of allergies.  The next day congestion started along with a non-productive cough that is most prominent in the morning.  He denies sob or dyspnea.  Cough is non-productive.  Associated generalized body aches.  No abdominal pain or nausea.   He has taken advil for his symptoms with little effect.    Patient Active Problem List   Diagnosis Date Noted  . Left sided sciatica 09/23/2014    Past Medical History  Diagnosis Date  . ADHD (attention deficit hyperactivity disorder)   . Tourette's     questionable per family  . Chronic low back pain     Past Surgical History  Procedure Laterality Date  . No past surgeries      Social History  Substance Use Topics  . Smoking status: Never Smoker   . Smokeless tobacco: Never Used  . Alcohol Use: No    Family History  Problem Relation Age of Onset  . Hypercholesterolemia Paternal Grandfather   . Hypertension Paternal Grandfather   . Hypothyroidism Paternal Grandmother   . Hypertension Paternal Grandmother   . Healthy Brother   . Healthy Sister     No Known Allergies  Medication list has been reviewed and updated.  Current Outpatient Prescriptions on File Prior to Visit  Medication Sig Dispense Refill  . cyclobenzaprine (FLEXERIL) 10 MG tablet Take 1 tablet (10 mg total) by mouth at bedtime as needed for muscle spasms. 30 tablet 0  . gabapentin (NEURONTIN) 300 MG capsule Take 1-2 tablets at bedtime as needed 180 capsule 0  . ibuprofen (ADVIL,MOTRIN) 600 MG tablet Take 1 tablet (600  mg total) by mouth every 8 (eight) hours as needed. 30 tablet 0  . methylphenidate (CONCERTA) 18 MG PO CR tablet     . sertraline (ZOLOFT) 50 MG tablet      No current facility-administered medications on file prior to visit.    ROS ROS otherwise unremarkable unless listed above.   Physical Examination: BP 117/75 mmHg  Pulse 86  Temp(Src) 98.4 F (36.9 C) (Oral)  Resp 16  Ht 6\' 1"  (1.854 m)  Wt 196 lb 3.2 oz (88.996 kg)  BMI 25.89 kg/m2  SpO2 98% Ideal Body Weight: Weight in (lb) to have BMI = 25: 189.1  Physical Exam  Constitutional: He is oriented to person, place, and time. He appears well-developed and well-nourished. No distress.  HENT:  Head: Normocephalic and atraumatic.  Right Ear: Tympanic membrane, external ear and ear canal normal.  Left Ear: Tympanic membrane, external ear and ear canal normal.  Nose: Mucosal edema and rhinorrhea present. Right sinus exhibits no maxillary sinus tenderness and no frontal sinus tenderness. Left sinus exhibits no maxillary sinus tenderness and no frontal sinus tenderness.  Mouth/Throat: No uvula swelling. Posterior oropharyngeal erythema present. No oropharyngeal exudate or posterior oropharyngeal edema.  Eyes: Conjunctivae, EOM and lids are normal. Pupils are equal, round, and reactive to light. Right eye exhibits normal  extraocular motion. Left eye exhibits normal extraocular motion.  Neck: Trachea normal and full passive range of motion without pain. No edema and no erythema present.  Cardiovascular: Normal rate.   Pulmonary/Chest: Effort normal. No respiratory distress. He has no decreased breath sounds. He has no wheezes. He has no rhonchi.  Neurological: He is alert and oriented to person, place, and time.  Skin: Skin is warm and dry. He is not diaphoretic.  Psychiatric: He has a normal mood and affect. His behavior is normal.   Results for orders placed or performed in visit on 03/12/16  POCT rapid strep A  Result Value Ref  Range   Rapid Strep A Screen Negative Negative      Assessment and Plan: Guy FiedlerDaniel Walton is a 25 y.o. male who is here today for congestion, and sore throat.   This appears to be viral at this time.  I have advised him to contact if his symptoms do not improve in 1 week, where an abx can be issued.   Advised mucinex and the atrovent nasal spray.   Continue tylenol or ibuprofen for throat pain, and/or cepacol lozenges.  Advised heavy hydration.  Viral URI with cough - Plan: ipratropium (ATROVENT) 0.03 % nasal spray  Throat pain - Plan: POCT rapid strep A, ipratropium (ATROVENT) 0.03 % nasal spray   Trena PlattStephanie Brittanee Ghazarian, PA-C [facility] 03/12/2016 3:49 PM

## 2016-03-12 NOTE — Patient Instructions (Addendum)
IF you received an x-ray today, you will receive an invoice from [facility]. Please contact [facility] at [phone] with questions or concerns regarding your invoice.   IF you received labwork today, you will receive an invoice from [facility]. Please contact [facility] at [phone] with questions or concerns regarding your invoice.   Our billing staff will not be able to assist you with questions regarding bills from these companies.  You will be contacted with the lab results as soon as they are available. The fastest way to get your results is to activate your My Chart account. Instructions are located on the last page of this paperwork. If you have not heard from us regarding the results in 2 weeks, please contact this office.    The strep was negative. Please hydrate well with 64 oz of water.   You can use cepacol throat lozenges, tylenol or ibuprofen for the throat pain.   Upper Respiratory Infection, Adult Most upper respiratory infections (URIs) are a viral infection of the air passages leading to the lungs. A URI affects the nose, throat, and upper air passages. The most common type of URI is nasopharyngitis and is typically referred to as "the common cold." URIs run their course and usually go away on their own. Most of the time, a URI does not require medical attention, but sometimes a bacterial infection in the upper airways can follow a viral infection. This is called a secondary infection. Sinus and middle ear infections are common types of secondary upper respiratory infections. Bacterial pneumonia can also complicate a URI. A URI can worsen asthma and chronic obstructive pulmonary disease (COPD). Sometimes, these complications can require emergency medical care and may be life threatening.  CAUSES Almost all URIs are caused by viruses. A virus is a type of germ and can spread from one person to another.  RISKS FACTORS You  may be at risk for a URI if:   You smoke.   You have chronic heart or lung disease.  You have a weakened defense (immune) system.   You are very young or very old.   You have nasal allergies or asthma.  You work in crowded or poorly ventilated areas.  You work in health care facilities or schools. SIGNS AND SYMPTOMS  Symptoms typically develop 2-3 days after you come in contact with a cold virus. Most viral URIs last 7-10 days. However, viral URIs from the influenza virus (flu virus) can last 14-18 days and are typically more severe. Symptoms may include:   Runny or stuffy (congested) nose.   Sneezing.   Cough.   Sore throat.   Headache.   Fatigue.   Fever.   Loss of appetite.   Pain in your forehead, behind your eyes, and over your cheekbones (sinus pain).  Muscle aches.  DIAGNOSIS  Your health care provider may diagnose a URI by:  Physical exam.  Tests to check that your symptoms are not due to another condition such as:  Strep throat.  Sinusitis.  Pneumonia.  Asthma. TREATMENT  A URI goes away on its own with time. It cannot be cured with medicines, but medicines may be prescribed or recommended to relieve symptoms. Medicines may help:  Reduce your fever.  Reduce your cough.  Relieve nasal congestion. HOME CARE INSTRUCTIONS   Take medicines only as directed by your health care provider.   Gargle warm saltwater or take cough drops to comfort your throat as directed by your  health care provider.  Use a warm mist humidifier or inhale steam from a shower to increase air moisture. This may make it easier to breathe.  Drink enough fluid to keep your urine clear or pale yellow.   Eat soups and other clear broths and maintain good nutrition.   Rest as needed.   Return to work when your temperature has returned to normal or as your health care provider advises. You may need to stay home longer to avoid infecting others. You can also  use a face mask and careful hand washing to prevent spread of the virus.  Increase the usage of your inhaler if you have asthma.   Do not use any tobacco products, including cigarettes, chewing tobacco, or electronic cigarettes. If you need help quitting, ask your health care provider. PREVENTION  The best way to protect yourself from getting a cold is to practice good hygiene.   Avoid oral or hand contact with people with cold symptoms.   Wash your hands often if contact occurs.  There is no clear evidence that vitamin C, vitamin E, echinacea, or exercise reduces the chance of developing a cold. However, it is always recommended to get plenty of rest, exercise, and practice good nutrition.  SEEK MEDICAL CARE IF:   You are getting worse rather than better.   Your symptoms are not controlled by medicine.   You have chills.  You have worsening shortness of breath.  You have brown or red mucus.  You have yellow or brown nasal discharge.  You have pain in your face, especially when you bend forward.  You have a fever.  You have swollen neck glands.  You have pain while swallowing.  You have white areas in the back of your throat. SEEK IMMEDIATE MEDICAL CARE IF:   You have severe or persistent:  Headache.  Ear pain.  Sinus pain.  Chest pain.  You have chronic lung disease and any of the following:  Wheezing.  Prolonged cough.  Coughing up blood.  A change in your usual mucus.  You have a stiff neck.  You have changes in your:  Vision.  Hearing.  Thinking.  Mood. MAKE SURE YOU:   Understand these instructions.  Will watch your condition.  Will get help right away if you are not doing well or get worse.   This information is not intended to replace advice given to you by your health care provider. Make sure you discuss any questions you have with your health care provider.   Document Released: 02/27/2001 Document Revised: 01/18/2015 Document  Reviewed: 12/09/2013 Elsevier Interactive Patient Education [employer]2016 Elsevier Inc.

## 2016-06-20 ENCOUNTER — Ambulatory Visit (INDEPENDENT_AMBULATORY_CARE_PROVIDER_SITE_OTHER): Payer: BC Managed Care – PPO | Admitting: Family Medicine

## 2016-06-20 VITALS — BP 106/72 | HR 93 | Temp 98.5°F | Resp 17 | Ht 73.0 in | Wt 201.0 lb

## 2016-06-20 DIAGNOSIS — J029 Acute pharyngitis, unspecified: Secondary | ICD-10-CM | POA: Diagnosis not present

## 2016-06-20 LAB — POCT RAPID STREP A (OFFICE): Rapid Strep A Screen: NEGATIVE

## 2016-06-20 MED ORDER — AMOXICILLIN 875 MG PO TABS: 875.0000 mg | ORAL_TABLET | Freq: Two times a day (BID) | ORAL | 0 refills | Status: DC

## 2016-06-20 NOTE — Patient Instructions (Addendum)
Amoxicillin 875 mg twice daily. Use throat lozenges and salt water gargles as needed.  IF you received an x-ray today, you will receive an invoice from [facility]. Please contact [facility] at [phone] with questions or concerns regarding your invoice.   IF you received labwork today, you will receive an invoice from [facility]. Please contact [facility] at [phone] with questions or concerns regarding your invoice.   Our billing staff will not be able to assist you with questions regarding bills from these companies.  You will be contacted with the lab results as soon as they are available. The fastest way to get your results is to activate your My Chart account. Instructions are located on the last page of this paperwork. If you have not heard from us regarding the results in 2 weeks, please contact this office.     Strep Throat Strep throat is an infection of the throat. It is caused by germs. Strep throat spreads from person to person because of coughing, sneezing, or close contact. HOME CARE Medicines  Take over-the-counter and prescription medicines only as told by your doctor.  Take your antibiotic medicine as told by your doctor. Do not stop taking the medicine even if you feel better.  Have family members who also have a sore throat or fever go to a doctor. Eating and Drinking  Do not share food, drinking cups, or personal items.  Try eating soft foods until your sore throat feels better.  Drink enough fluid to keep your pee (urine) clear or pale yellow. General Instructions  Rinse your mouth (gargle) with a salt-water mixture 3-4 times per day or as needed. To make a salt-water mixture, stir -1 tsp of salt into 1 cup of warm water.  Make sure that all people in your house wash their hands well.  Rest.  Stay home from school or work until you have been taking antibiotics for 24 hours.  Keep all follow-up visits  as told by your doctor. This is important. GET HELP IF:  Your neck keeps getting bigger.  You get a rash, cough, or earache.  You cough up thick liquid that is green, yellow-brown, or bloody.  You have pain that does not get better with medicine.  Your problems get worse instead of getting better.  You have a fever. GET HELP RIGHT AWAY IF:  You throw up (vomit).  You get a very bad headache.  You neck hurts or it feels stiff.  You have chest pain or you are short of breath.  You have drooling, very bad throat pain, or changes in your voice.  Your neck is swollen or the skin gets red and tender.  Your mouth is dry or you are peeing less than normal.  You keep feeling more tired or it is hard to wake up.  Your joints are red or they hurt.   This information is not intended to replace advice given to you by your health care provider. Make sure you discuss any questions you have with your health care provider.   Document Released: 02/20/2008 Document Revised: 05/25/2015 Document Reviewed: 12/27/2014 Elsevier Interactive Patient Education [employer]2016 Elsevier Inc.

## 2016-06-20 NOTE — Progress Notes (Signed)
Patient ID: Guy Walton, male    DOB: September 03, 1991, 25 y.o.   MRN: [id number]  PCP: Sissy Hoff, MD  Chief Complaint  Patient presents with  . Sore Throat    Since monday     Subjective:   HPI 25 year old presents for evaluation of sore throat times 12 days ago. He reports he was seen by another provider and diagnosed with "step throat".  He reports being treated with antibiotic of which he only took one day as he began feeling better upon awakening the next morning. He reports for the last three days he experienced increase pain with swallowing, weakness and fatigue.  He is unaware if he's had a fever. He has taken ibuprofen daily since he developed the sore throat with mild to moderate relief of symptoms.   Social History   Social History  . Marital status: Single    Spouse name: n/a  . Number of children: 0  . Years of education: N/A   Occupational History  . 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 Smoking status: Never Smoker  . Smokeless tobacco: Never Used  . Alcohol use No  . Drug use: No  . Sexual activity: Yes    Partners: Female   Other Topics Concern  . Not on file   Social History Narrative   Lives with his grandparents.   He works at [employer] in [employer] center   [employer] of education:  H.S., starting courses at [employer]    Family History  Problem Relation Age of Onset  . Hypercholesterolemia Paternal Grandfather   . Hypertension Paternal Grandfather   . Hypothyroidism Paternal Grandmother   . Hypertension Paternal Grandmother   . Healthy Brother   . Healthy Sister     Review of Systems See HPI Patient Active Problem List   Diagnosis Date Noted  . Left sided sciatica 09/23/2014     Prior to Admission medications   Medication Sig Start Date End Date Taking? Authorizing Provider  gabapentin  (NEURONTIN) 300 MG capsule Take 1-2 tablets at bedtime as needed 02/28/16  Yes Guinevere Scarlet, MD  ibuprofen (ADVIL,MOTRIN) 600 MG tablet Take 1 tablet (600 mg total) by mouth every 8 (eight) hours as needed. 10/06/14  Yes Ofilia Neas, PA-C  methylphenidate (CONCERTA) 18 MG PO CR tablet  12/09/14  Yes Historical Provider, MD  sertraline (ZOLOFT) 50 MG tablet  01/10/15  Yes Historical Provider, MD  No Known Allergies     Objective:  Physical Exam  Constitutional: He is oriented to person, place, and time.  HENT:  Head: Normocephalic and atraumatic.  Right Ear: External ear normal.  Left Ear: External ear normal.  Nose: Nose normal.  Mouth/Throat: Oropharyngeal exudate present.  Throat is erythematous, uvula erythematous, tonsils +2, white exudate present on posterior pharyngeal wall.    Eyes: EOM are normal. Pupils are equal, round, and reactive to light.  Neck: Normal range of motion. Neck supple.  Cardiovascular: Normal rate, regular rhythm, normal heart sounds and intact distal pulses.   Pulmonary/Chest: Effort normal and breath sounds normal.  Musculoskeletal: Normal range of motion.  Lymphadenopathy:    He has no cervical adenopathy.  Neurological: He is alert and oriented to person, place, and time. He has normal reflexes.  Skin: Skin is warm and dry.  Psychiatric: He has a normal mood  and affect. His behavior is normal. Judgment and thought content normal.    Vitals:   06/20/16 1346  BP: 106/72  Pulse: 93  Resp: 17  Temp: 98.5 F (36.9 C)     Assessment & Plan:  1. Sore throat - POCT rapid strep A - Culture, Group A Strep  Rapid strep negative. I will treat patient empirically for bacterial pharyngitis due to physical appearance  of the patient's throat. Throat is erythematous, uvula erythematous, tonsils +2, white exudate present on posterior pharyngeal wall.  Reinforced the importance of finishing all antibiotic to reduce the incidences of resistance.  Marland Kitchen.  amoxicillin (AMOXIL) 875 MG tablet    Sig: Take 1 tablet (875 mg total) by mouth 2 (two) times daily.   RTC if symptoms do not resolve.  Godfrey PickKimberly S. Tiburcio PeaHarris, MSN, FNP-C [facility]

## 2016-06-22 LAB — CULTURE, GROUP A STREP: ORGANISM ID, BACTERIA: NORMAL

## 2016-06-23 ENCOUNTER — Encounter: Payer: Self-pay | Admitting: Family Medicine

## 2016-07-16 ENCOUNTER — Ambulatory Visit (INDEPENDENT_AMBULATORY_CARE_PROVIDER_SITE_OTHER): Payer: BC Managed Care – PPO | Admitting: Family Medicine

## 2016-07-16 VITALS — BP 116/72 | HR 79 | Temp 98.1°F | Resp 17 | Ht 73.0 in | Wt 210.0 lb

## 2016-07-16 DIAGNOSIS — Z202 Contact with and (suspected) exposure to infections with a predominantly sexual mode of transmission: Secondary | ICD-10-CM

## 2016-07-16 MED ORDER — AZITHROMYCIN 500 MG PO TABS: ORAL_TABLET | ORAL | 0 refills | Status: DC

## 2016-07-16 MED ORDER — CEFTRIAXONE SODIUM 250 MG IJ SOLR
250.0000 mg | Freq: Once | INTRAMUSCULAR | Status: AC
  Administered 2016-07-16: 250 mg via INTRAMUSCULAR

## 2016-07-16 NOTE — Progress Notes (Signed)
  Chief Complaint  Patient presents with  . Exposure to STD    Girlfriend tested postive for chlamydia. PT REFUSED TO GIVE URINE SAMPLE STATING "CANT YALL JUST TREAT ME"    HPI   Pt reports that his girlfriend was positive for Chlamydia. She was treated. He states that his sexual partner told him to get treated.  He states that he gets a urethral tingle.  He felt like his girlfriends "vagina was acting up for a while" which he describes as a foul smell and discharge with pain with sexual intercourse.  He reports that he did not have any penile discharge or blood.  Denies pain with ejaculation.   Past Medical History:  Diagnosis Date  . ADHD (attention deficit hyperactivity disorder)   . Chronic low back pain   . Tourette's    questionable per family    Current Outpatient Prescriptions  Medication Sig Dispense Refill  . gabapentin (NEURONTIN) 300 MG capsule Take 1-2 tablets at bedtime as needed 180 capsule 0  . methylphenidate (CONCERTA) 18 MG PO CR tablet     . sertraline (ZOLOFT) 50 MG tablet      No current facility-administered medications for this visit.     Allergies: No Known Allergies  Past Surgical History:  Procedure Laterality Date  . NO PAST SURGERIES      Social History   Social History  . Marital status: Single    Spouse name: n/a  . Number of children: 0  . Years of education: N/A   Occupational History  . Student [employer]    [employer]  . freezer selector     [employer]   Social History Main Topics  . Smoking status: Never Smoker  . Smokeless tobacco: Never Used  . Alcohol use No  . Drug use: No  . Sexual activity: Yes    Partners: Female   Other Topics Concern  . None   Social History Narrative   Lives with his grandparents.   He works at [employer] in [employer]   [employer] level of education:  H.S., starting courses at [employer]    ROS  Objective: Vitals:     07/16/16 1511  BP: 116/72  Pulse: 79  Resp: 17  Temp: 98.1 F (36.7 C)  TempSrc: Oral  SpO2: 99%  Weight: 210 lb (95.3 kg)  Height: 6\' 1"  (1.854 m)    Physical Exam  Constitutional: He appears well-developed and well-nourished.  Eyes: Conjunctivae and EOM are normal.  Pulmonary/Chest: Effort normal.     Assessment and Plan Guy Walton was seen today for exposure to std.  Diagnoses and all orders for this visit:  Exposure to STD- discussed screening for HIV and Syphilis in addition  -     GC/Chlamydia Probe Amp -     RPR -     HIV antibody     Guy Walton

## 2016-07-16 NOTE — Patient Instructions (Addendum)
   IF you received an x-ray today, you will receive an invoice from [facility]. Please contact [facility] at [phone] with questions or concerns regarding your invoice.   IF you received labwork today, you will receive an invoice from [facility]. Please contact [facility] at [phone] with questions or concerns regarding your invoice.   Our billing staff will not be able to assist you with questions regarding bills from these companies.  You will be contacted with the lab results as soon as they are available. The fastest way to get your results is to activate your My Chart account. Instructions are located on the last page of this paperwork. If you have not heard from us regarding the results in 2 weeks, please contact this office.    Sexually Transmitted Disease A sexually transmitted disease (STD) is a disease or infection that may be passed (transmitted) from person to person, usually during sexual activity. This may happen by way of saliva, semen, blood, vaginal mucus, or urine. Common STDs include:  Gonorrhea.  Chlamydia.  Syphilis.  HIV and AIDS.  Genital herpes.  Hepatitis B and C.  Trichomonas.  Human papillomavirus (HPV).  Pubic lice.  Scabies.  Mites.  Bacterial vaginosis. WHAT ARE CAUSES OF STDs? An STD may be caused by bacteria, a virus, or parasites. STDs are often transmitted during sexual activity if one person is infected. However, they may also be transmitted through nonsexual means. STDs may be transmitted after:   Sexual intercourse with an infected person.  Sharing sex toys with an infected person.  Sharing needles with an infected person or using unclean piercing or tattoo needles.  Having intimate contact with the genitals, mouth, or rectal areas of an infected person.  Exposure to infected fluids during birth. WHAT ARE THE SIGNS AND SYMPTOMS OF STDs? Different STDs have different symptoms.  Some people may not have any symptoms. If symptoms are present, they may include:  Painful or bloody urination.  Pain in the pelvis, abdomen, vagina, anus, throat, or eyes.  A skin rash, itching, or irritation.  Growths, ulcerations, blisters, or sores in the genital and anal areas.  Abnormal vaginal discharge with or without bad odor.  Penile discharge in men.  Fever.  Pain or bleeding during sexual intercourse.  Swollen glands in the groin area.  Yellow skin and eyes (jaundice). This is seen with hepatitis.  Swollen testicles.  Infertility.  Sores and blisters in the mouth. HOW ARE STDs DIAGNOSED? To make a diagnosis, your health care provider may:  Take a medical history.  Perform a physical exam.  Take a sample of any discharge to examine.  Swab the throat, cervix, opening to the penis, rectum, or vagina for testing.  Test a sample of your first morning urine.  Perform blood tests.  Perform a Pap test, if this applies.  Perform a colposcopy.  Perform a laparoscopy. HOW ARE STDs TREATED? Treatment depends on the STD. Some STDs may be treated but not cured.  Chlamydia, gonorrhea, trichomonas, and syphilis can be cured with antibiotic medicine.  Genital herpes, hepatitis, and HIV can be treated, but not cured, with prescribed medicines. The medicines lessen symptoms.  Genital warts from HPV can be treated with medicine or by freezing, burning (electrocautery), or surgery. Warts may come back.  HPV cannot be cured with medicine or surgery. However, abnormal areas may be removed from the cervix, vagina, or vulva.  If your diagnosis is confirmed, your recent sexual partners need treatment.   This is true even if they are symptom-free or have a negative culture or evaluation. They should not have sex until their health care providers say it is okay.  Your health care provider may test you for infection again 3 months after treatment. HOW CAN I REDUCE MY RISK OF  GETTING AN STD? Take these steps to reduce your risk of getting an STD:  Use latex condoms, dental dams, and water-soluble lubricants during sexual activity. Do not use petroleum jelly or oils.  Avoid having multiple sex partners.  Do not have sex with someone who has other sex partners  Do not have sex with anyone you do not know or who is at high risk for an STD.  Avoid risky sex practices that can break your skin.  Do not have sex if you have open sores on your mouth or skin.  Avoid drinking too much alcohol or taking illegal drugs. Alcohol and drugs can affect your judgment and put you in a vulnerable position.  Avoid engaging in oral and anal sex acts.  Get vaccinated for HPV and hepatitis. If you have not received these vaccines in the past, talk to your health care provider about whether one or both might be right for you.  If you are at risk of being infected with HIV, it is recommended that you take a prescription medicine daily to prevent HIV infection. This is called pre-exposure prophylaxis (PrEP). You are considered at risk if:  You are a man who has sex with other men (MSM).  You are a heterosexual man or woman and are sexually active with more than one partner.  You take drugs by injection.  You are sexually active with a partner who has HIV.  Talk with your health care provider about whether you are at high risk of being infected with HIV. If you choose to begin PrEP, you should first be tested for HIV. You should then be tested every 3 months for as long as you are taking PrEP. WHAT SHOULD I DO IF I THINK I HAVE AN STD?  See your health care provider.  Tell your sexual partner(s). They should be tested and treated for any STDs.  Do not have sex until your health care provider says it is okay. WHEN SHOULD I GET IMMEDIATE MEDICAL CARE? Contact your health care provider right away if:   You have severe abdominal pain.  You are a man and notice swelling or  pain in your testicles.  You are a woman and notice swelling or pain in your vagina.   This information is not intended to replace advice given to you by your health care provider. Make sure you discuss any questions you have with your health care provider.   Document Released: 11/24/2002 Document Revised: 09/24/2014 Document Reviewed: 03/24/2013 Elsevier Interactive Patient Education 2016 Elsevier Inc.  

## 2016-07-17 LAB — GC/CHLAMYDIA PROBE AMP
CT Probe RNA: NOT DETECTED
GC Probe RNA: NOT DETECTED

## 2016-07-17 LAB — RPR

## 2016-07-17 LAB — HIV ANTIBODY (ROUTINE TESTING W REFLEX): HIV: NONREACTIVE

## 2016-08-07 ENCOUNTER — Ambulatory Visit (INDEPENDENT_AMBULATORY_CARE_PROVIDER_SITE_OTHER): Payer: BC Managed Care – PPO | Admitting: Family Medicine

## 2016-08-07 VITALS — BP 114/72 | HR 81 | Temp 98.4°F | Resp 17 | Ht 73.0 in | Wt 208.0 lb

## 2016-08-07 DIAGNOSIS — J069 Acute upper respiratory infection, unspecified: Secondary | ICD-10-CM

## 2016-08-07 NOTE — Progress Notes (Signed)
By signing my name below, I, [name], attest that this documentation has been prepared under the direction and in the presence of Meredith StaggersJeffrey Gatlyn Lipari, MD.  Electronically Signed: Arvilla MarketMesha Guinyard, Medical Scribe. 08/07/16. 4:05 PM.  Subjective:    Patient ID: Guy Fiedleraniel Walton, male    DOB: 1990-12-15, 25 y.o.   MRN: [id number]  HPI Chief Complaint  Patient presents with  . Sore Throat    And congestion, started last night.    HPI Comments: Guy FiedlerDaniel Stogner is a 25 y.o. male who presents to the [facility] complaining of congestion onset last night. Reports associated symptoms of sore throat, fatigue, and HA. Took advil without relief of his symptoms. Pt's girlfriend is a month pregnant and he doesn't want to get her sick. Denies sick contacts. Denies fever, and cough.  Patient Active Problem List   Diagnosis Date Noted  . Left sided sciatica 09/23/2014   Past Medical History:  Diagnosis Date  . ADHD (attention deficit hyperactivity disorder)   . Chronic low back pain   . Tourette's    questionable per family   Past Surgical History:  Procedure Laterality Date  . NO PAST SURGERIES     No Known Allergies Prior to Admission medications   Medication Sig Start Date End Date Taking? Authorizing Provider  gabapentin (NEURONTIN) 300 MG capsule Take 1-2 tablets at bedtime as needed 02/28/16  Yes Guinevere ScarletBlake Williams, MD  methylphenidate (CONCERTA) 18 MG PO CR tablet  12/09/14  Yes Historical Provider, MD  sertraline (ZOLOFT) 50 MG tablet  01/10/15  Yes Historical Provider, MD   Social History   Social History  . Marital status: Significant Other    Spouse name: n/a  . Number of children: 0  . Years of education: N/A   Occupational History  . Student [name]; physical therapy assistant program  . freezer selector     [name]   Social History Main Topics  . Smoking status: Never Smoker  . Smokeless tobacco: Never Used  .  Alcohol use No  . Drug use: No  . Sexual activity: Yes    Partners: Female   Other Topics Concern  . Not on file   Social History Narrative   Lives with his grandparents.   He works at [employer] in [employer]   [employer] level of education:  H.S., starting courses at [employer]   Review of Systems  Constitutional: Positive for fatigue. Negative for fever.  HENT: Positive for congestion and sore throat.   Respiratory: Negative for cough.   Neurological: Positive for headaches.   Objective:  Physical Exam  Constitutional: He is oriented to person, place, and time. He appears well-developed and well-nourished.  HENT:  Head: Normocephalic and atraumatic.  Right Ear: Tympanic membrane, external ear and ear canal normal.  Left Ear: Tympanic membrane, external ear and ear canal normal.  Nose: No rhinorrhea. Right sinus exhibits no maxillary sinus tenderness and no frontal sinus tenderness. Left sinus exhibits no maxillary sinus tenderness and no frontal sinus tenderness.  Mouth/Throat: Oropharynx is clear and moist and mucous membranes are normal. No oropharyngeal exudate or posterior oropharyngeal erythema.  Eyes: Conjunctivae are normal. Pupils are equal, round, and reactive to light.  Neck: Neck supple.  Cardiovascular: Normal rate, regular rhythm, normal heart sounds and intact distal pulses.  Exam reveals no friction rub.   No murmur heard. Pulmonary/Chest: Effort normal and breath sounds normal. No respiratory distress. He  has no wheezes. He has no rhonchi. He has no rales.  Abdominal: Soft. There is no tenderness.  Lymphadenopathy:    He has no cervical adenopathy.  Neurological: He is alert and oriented to person, place, and time.  Skin: Skin is warm and dry. No rash noted.  Psychiatric: He has a normal mood and affect. His behavior is normal.  Vitals reviewed.  BP 114/72 (BP Location: Right Arm, Patient Position: Sitting, Cuff Size: Large)   Pulse 81    Temp 98.4 F (36.9 C) (Oral)   Resp 17   Ht 6\' 1"  (1.854 m)   Wt 208 lb (94.3 kg)   SpO2 98%   BMI 27.44 kg/m  Assessment & Plan:   [name] is a 25 y.o. male Acute upper respiratory infection  - viral illness likely, symptomatic care.   - rtc precautions.   No orders of the defined types were placed in this encounter.  Patient Instructions   Saline nasal spray atleast 4 times per day, over the counter mucinex or mucinex DM if needed for cough, Cepacol or other lozenge if needed for sore throat, drink plenty of fluids.   Return to the clinic or go to the nearest emergency room if any of your symptoms worsen or new symptoms occur.  Upper Respiratory Infection, Adult Most upper respiratory infections (URIs) are a viral infection of the air passages leading to the lungs. A URI affects the nose, throat, and upper air passages. The most common type of URI is nasopharyngitis and is typically referred to as "the common cold." URIs run their course and usually go away on their own. Most of the time, a URI does not require medical attention, but sometimes a bacterial infection in the upper airways can follow a viral infection. This is called a secondary infection. Sinus and middle ear infections are common types of secondary upper respiratory infections. Bacterial pneumonia can also complicate a URI. A URI can worsen asthma and chronic obstructive pulmonary disease (COPD). Sometimes, these complications can require emergency medical care and may be life threatening. What are the causes? Almost all URIs are caused by viruses. A virus is a type of germ and can spread from one person to another. What increases the risk? You may be at risk for a URI if:  You smoke.  You have chronic heart or lung disease.  You have a weakened defense (immune) system.  You are very young or very old.  You have nasal allergies or asthma.  You work in crowded or poorly ventilated areas.  You work  in health care facilities or schools. What are the signs or symptoms? Symptoms typically develop 2-3 days after you come in contact with a cold virus. Most viral URIs last 7-10 days. However, viral URIs from the influenza virus (flu virus) can last 14-18 days and are typically more severe. Symptoms may include:  Runny or stuffy (congested) nose.  Sneezing.  Cough.  Sore throat.  Headache.  Fatigue.  Fever.  Loss of appetite.  Pain in your forehead, behind your eyes, and over your cheekbones (sinus pain).  Muscle aches. How is this diagnosed? Your health care provider may diagnose a URI by:  Physical exam.  Tests to check that your symptoms are not due to another condition such as:  Strep throat.  Sinusitis.  Pneumonia.  Asthma. How is this treated? A URI goes away on its own with time. It cannot be cured with medicines, but medicines may be prescribed or  recommended to relieve symptoms. Medicines may help:  Reduce your fever.  Reduce your cough.  Relieve nasal congestion. Follow these instructions at home:  Take medicines only as directed by your health care provider.  Gargle warm saltwater or take cough drops to comfort your throat as directed by your health care provider.  Use a warm mist humidifier or inhale steam from a shower to increase air moisture. This may make it easier to breathe.  Drink enough fluid to keep your urine clear or pale yellow.  Eat soups and other clear broths and maintain good nutrition.  Rest as needed.  Return to work when your temperature has returned to normal or as your health care provider advises. You may need to stay home longer to avoid infecting others. You can also use a face mask and careful hand washing to prevent spread of the virus.  Increase the usage of your inhaler if you have asthma.  Do not use any tobacco products, including cigarettes, chewing tobacco, or electronic cigarettes. If you need help quitting,  ask your health care provider. How is this prevented? The best way to protect yourself from getting a cold is to practice good hygiene.  Avoid oral or hand contact with people with cold symptoms.  Wash your hands often if contact occurs. There is no clear evidence that vitamin C, vitamin E, echinacea, or exercise reduces the chance of developing a cold. However, it is always recommended to get plenty of rest, exercise, and practice good nutrition. Contact a health care provider if:  You are getting worse rather than better.  Your symptoms are not controlled by medicine.  You have chills.  You have worsening shortness of breath.  You have brown or red mucus.  You have yellow or brown nasal discharge.  You have pain in your face, especially when you bend forward.  You have a fever.  You have swollen neck glands.  You have pain while swallowing.  You have white areas in the back of your throat. Get help right away if:  You have severe or persistent:  Headache.  Ear pain.  Sinus pain.  Chest pain.  You have chronic lung disease and any of the following:  Wheezing.  Prolonged cough.  Coughing up blood.  A change in your usual mucus.  You have a stiff neck.  You have changes in your:  Vision.  Hearing.  Thinking.  Mood. This information is not intended to replace advice given to you by your health care provider. Make sure you discuss any questions you have with your health care provider. Document Released: 02/27/2001 Document Revised: 05/06/2016 Document Reviewed: 12/09/2013 Elsevier Interactive Patient Education  2017 [employer]    IF you received an x-ray today, you will receive an invoice from [facility]. Please contact [facility] at [phone] with questions or concerns regarding your invoice.   IF you received labwork today, you will receive an invoice from [facility]. Please contact [facility]  at [phone] with questions or concerns regarding your invoice.   Our billing staff will not be able to assist you with questions regarding bills from these companies.  You will be contacted with the lab results as soon as they are available. The fastest way to get your results is to activate your My Chart account. Instructions are located on the last page of this paperwork. If you have not heard from us regarding the results in 2 weeks, please contact this office.     \  I personally performed the services described in this documentation, which was scribed in my presence. The recorded information has been reviewed and considered, and addended by me as needed.   Signed,   Meredith Staggers, MD [facility].  08/07/16 7:18 PM

## 2016-08-07 NOTE — Patient Instructions (Addendum)
Saline nasal spray atleast 4 times per day, over the counter mucinex or mucinex DM if needed for cough, Cepacol or other lozenge if needed for sore throat, drink plenty of fluids.   Return to the clinic or go to the nearest emergency room if any of your symptoms worsen or new symptoms occur.  Upper Respiratory Infection, Adult Most upper respiratory infections (URIs) are a viral infection of the air passages leading to the lungs. A URI affects the nose, throat, and upper air passages. The most common type of URI is nasopharyngitis and is typically referred to as "the common cold." URIs run their course and usually go away on their own. Most of the time, a URI does not require medical attention, but sometimes a bacterial infection in the upper airways can follow a viral infection. This is called a secondary infection. Sinus and middle ear infections are common types of secondary upper respiratory infections. Bacterial pneumonia can also complicate a URI. A URI can worsen asthma and chronic obstructive pulmonary disease (COPD). Sometimes, these complications can require emergency medical care and may be life threatening. What are the causes? Almost all URIs are caused by viruses. A virus is a type of germ and can spread from one person to another. What increases the risk? You may be at risk for a URI if:  You smoke.  You have chronic heart or lung disease.  You have a weakened defense (immune) system.  You are very young or very old.  You have nasal allergies or asthma.  You work in crowded or poorly ventilated areas.  You work in health care facilities or schools. What are the signs or symptoms? Symptoms typically develop 2-3 days after you come in contact with a cold virus. Most viral URIs last 7-10 days. However, viral URIs from the influenza virus (flu virus) can last 14-18 days and are typically more severe. Symptoms may include:  Runny or stuffy (congested)  nose.  Sneezing.  Cough.  Sore throat.  Headache.  Fatigue.  Fever.  Loss of appetite.  Pain in your forehead, behind your eyes, and over your cheekbones (sinus pain).  Muscle aches. How is this diagnosed? Your health care provider may diagnose a URI by:  Physical exam.  Tests to check that your symptoms are not due to another condition such as:  Strep throat.  Sinusitis.  Pneumonia.  Asthma. How is this treated? A URI goes away on its own with time. It cannot be cured with medicines, but medicines may be prescribed or recommended to relieve symptoms. Medicines may help:  Reduce your fever.  Reduce your cough.  Relieve nasal congestion. Follow these instructions at home:  Take medicines only as directed by your health care provider.  Gargle warm saltwater or take cough drops to comfort your throat as directed by your health care provider.  Use a warm mist humidifier or inhale steam from a shower to increase air moisture. This may make it easier to breathe.  Drink enough fluid to keep your urine clear or pale yellow.  Eat soups and other clear broths and maintain good nutrition.  Rest as needed.  Return to work when your temperature has returned to normal or as your health care provider advises. You may need to stay home longer to avoid infecting others. You can also use a face mask and careful hand washing to prevent spread of the virus.  Increase the usage of your inhaler if you have asthma.  Do not use any  tobacco products, including cigarettes, chewing tobacco, or electronic cigarettes. If you need help quitting, ask your health care provider. How is this prevented? The best way to protect yourself from getting a cold is to practice good hygiene.  Avoid oral or hand contact with people with cold symptoms.  Wash your hands often if contact occurs. There is no clear evidence that vitamin C, vitamin E, echinacea, or exercise reduces the chance of  developing a cold. However, it is always recommended to get plenty of rest, exercise, and practice good nutrition. Contact a health care provider if:  You are getting worse rather than better.  Your symptoms are not controlled by medicine.  You have chills.  You have worsening shortness of breath.  You have brown or red mucus.  You have yellow or brown nasal discharge.  You have pain in your face, especially when you bend forward.  You have a fever.  You have swollen neck glands.  You have pain while swallowing.  You have white areas in the back of your throat. Get help right away if:  You have severe or persistent:  Headache.  Ear pain.  Sinus pain.  Chest pain.  You have chronic lung disease and any of the following:  Wheezing.  Prolonged cough.  Coughing up blood.  A change in your usual mucus.  You have a stiff neck.  You have changes in your:  Vision.  Hearing.  Thinking.  Mood. This information is not intended to replace advice given to you by your health care provider. Make sure you discuss any questions you have with your health care provider. Document Released: 02/27/2001 Document Revised: 05/06/2016 Document Reviewed: 12/09/2013 Elsevier Interactive Patient Education  2017 [employer]    IF you received an x-ray today, you will receive an invoice from [facility]. Please contact [facility] at [phone] with questions or concerns regarding your invoice.   IF you received labwork today, you will receive an invoice from [facility]. Please contact [facility] at [phone] with questions or concerns regarding your invoice.   Our billing staff will not be able to assist you with questions regarding bills from these companies.  You will be contacted with the lab results as soon as they are available. The fastest way to get your results is to activate your My Chart account. Instructions are  located on the last page of this paperwork. If you have not heard from us regarding the results in 2 weeks, please contact this office.     \

## 2016-08-31 ENCOUNTER — Ambulatory Visit (INDEPENDENT_AMBULATORY_CARE_PROVIDER_SITE_OTHER): Payer: BC Managed Care – PPO | Admitting: Physician Assistant

## 2016-08-31 VITALS — BP 122/72 | HR 78 | Temp 98.2°F | Resp 17 | Ht 73.5 in | Wt 205.0 lb

## 2016-08-31 DIAGNOSIS — R109 Unspecified abdominal pain: Secondary | ICD-10-CM | POA: Diagnosis not present

## 2016-08-31 DIAGNOSIS — R197 Diarrhea, unspecified: Secondary | ICD-10-CM | POA: Diagnosis not present

## 2016-08-31 LAB — POCT CBC
Granulocyte percent: 63.5 %G (ref 37–80)
HEMATOCRIT: 44.6 % (ref 43.5–53.7)
HEMOGLOBIN: 16 g/dL (ref 14.1–18.1)
LYMPH, POC: 1.7 (ref 0.6–3.4)
MCH, POC: 31.2 pg (ref 27–31.2)
MCHC: 35.9 g/dL — AB (ref 31.8–35.4)
MCV: 87 fL (ref 80–97)
MID (cbc): 0.4 (ref 0–0.9)
MPV: 7.5 fL (ref 0–99.8)
POC GRANULOCYTE: 3.7 (ref 2–6.9)
POC LYMPH PERCENT: 29.7 %L (ref 10–50)
POC MID %: 6.8 %M (ref 0–12)
Platelet Count, POC: 155 10*3/uL (ref 142–424)
RBC: 5.12 M/uL (ref 4.69–6.13)
RDW, POC: 12.8 %
WBC: 5.8 10*3/uL (ref 4.6–10.2)

## 2016-08-31 LAB — POCT URINALYSIS DIP (MANUAL ENTRY)
Bilirubin, UA: NEGATIVE
Blood, UA: NEGATIVE
Glucose, UA: NEGATIVE
Ketones, POC UA: NEGATIVE
LEUKOCYTES UA: NEGATIVE
Nitrite, UA: NEGATIVE
PROTEIN UA: NEGATIVE
Urobilinogen, UA: 0.2
pH, UA: 5.5

## 2016-08-31 LAB — POC MICROSCOPIC URINALYSIS (UMFC): Mucus: ABSENT

## 2016-08-31 MED ORDER — HYOSCYAMINE SULFATE SL 0.125 MG SL SUBL: 0.1250 mg | SUBLINGUAL_TABLET | SUBLINGUAL | 0 refills | Status: DC | PRN

## 2016-08-31 NOTE — Progress Notes (Signed)
[facility] [address], [place] [address] [phone]  Date:  08/31/2016   Name:  Guy FiedlerDaniel Fidler   DOB:  [birth year]   MRN:  [id number]  PCP:  Sissy HoffSWAYNE,DAVID W, MD    History of Present Illness:  Guy Walton is a 25 y.o. male patient who [facility] abdominal pain and diarrhea. Symptoms began at 5 am this morning with waking with diarrhea and abdominal pain.  Non-bloody, and more watery.  The bm helped to relieve his abdominal pain, but 2 hours later, the sensation returned and diarrhea.  No fever.  No nausrea.  No hemautria, or dysuria, but has some frequency.  No hx of abdominal pain or GI issues.  No recent abx, hospitalizations, or contacts with GI issues.  Does not recall eating anything uncooked.  Patient Active Problem List   Diagnosis Date Noted  . Left sided sciatica 09/23/2014    Past Medical History:  Diagnosis Date  . ADHD (attention deficit hyperactivity disorder)   . Chronic low back pain   . Tourette's    questionable per family    Past Surgical History:  Procedure Laterality Date  . NO PAST SURGERIES      Social History  Substance Use Topics  . Smoking status: Never Smoker  . Smokeless tobacco: Never Used  . Alcohol use No    Family History  Problem Relation Age of Onset  . Hypercholesterolemia Paternal Grandfather   . Hypertension Paternal Grandfather   . Hypothyroidism Paternal Grandmother   . Hypertension Paternal Grandmother   . Healthy Brother   . Healthy Sister     No Known Allergies  Medication list has been reviewed and updated.  Current Outpatient Prescriptions on File Prior to Visit  Medication Sig Dispense Refill  . gabapentin (NEURONTIN) 300 MG capsule Take 1-2 tablets at bedtime as needed 180 capsule 0  . methylphenidate (CONCERTA) 18 MG PO CR tablet     . sertraline (ZOLOFT) 50 MG tablet      No current facility-administered medications on file prior to visit.     ROS ROS  otherwise unremarkable unless listed above.   Physical Examination: BP 122/72 (BP Location: Right Arm, Patient Position: Sitting, Cuff Size: Normal)   Pulse 78   Temp 98.2 F (36.8 C) (Oral)   Resp 17   Ht 6' 1.5" (1.867 m)   Wt 205 lb (93 kg)   SpO2 97%   BMI 26.68 kg/m  Ideal Body Weight: Weight in (lb) to have BMI = 25: 191.7  Physical Exam  Constitutional: He is oriented to person, place, and time. He appears well-developed and well-nourished. No distress.  HENT:  Head: Normocephalic and atraumatic.  Eyes: Conjunctivae and EOM are normal. Pupils are equal, round, and reactive to light.  Cardiovascular: Normal rate.   Pulmonary/Chest: Effort normal. No respiratory distress.  Abdominal: Soft. Normal appearance. Bowel sounds are increased. There is no tenderness. There is no CVA tenderness, no tenderness at McBurney's point and negative Murphy's sign.  Neurological: He is alert and oriented to person, place, and time.  Skin: Skin is warm and dry. He is not diaphoretic.  Psychiatric: He has a normal mood and affect. His behavior is normal.   Results for orders placed or performed in visit on 08/31/16  POCT CBC  Result Value Ref Range   WBC 5.8 4.6 - 10.2 K/uL   Lymph, poc 1.7 0.6 - 3.4   POC LYMPH PERCENT 29.7 10 -  50 %L   MID (cbc) 0.4 0 - 0.9   POC MID % 6.8 0 - 12 %M   POC Granulocyte 3.7 2 - 6.9   Granulocyte percent 63.5 37 - 80 %G   RBC 5.12 4.69 - 6.13 M/uL   Hemoglobin 16.0 14.1 - 18.1 g/dL   HCT, POC 52.844.6 41.343.5 - 53.7 %   MCV 87.0 80 - 97 fL   MCH, POC 31.2 27 - 31.2 pg   MCHC 35.9 (A) 31.8 - 35.4 g/dL   RDW, POC 24.412.8 %   Platelet Count, POC 155 142 - 424 K/uL   MPV 7.5 0 - 99.8 fL  POCT urinalysis dipstick  Result Value Ref Range   Color, UA yellow yellow   Clarity, UA clear clear   Glucose, UA negative negative   Bilirubin, UA negative negative   Ketones, POC UA negative negative   Spec Grav, UA >=1.030    Blood, UA negative negative   pH, UA 5.5     Protein Ur, POC negative negative   Urobilinogen, UA 0.2    Nitrite, UA Negative Negative   Leukocytes, UA Negative Negative  POCT Microscopic Urinalysis ([facility])  Result Value Ref Range   WBC,UR,HPF,POC None None WBC/hpf   RBC,UR,HPF,POC None None RBC/hpf   Bacteria None None, Too numerous to count   Mucus Absent Absent   Epithelial Cells, UR Per Microscopy Few (A) None, Too numerous to count cells/hpf    Assessment and Plan: Guy FiedlerDaniel Nee is a 25 y.o. male who is here today of abdominal pain. --likely GI virus.  Diff includes bacterial Gastroenteritis, diverticulitis, c.diff --appears like it may be easing up.  Given diarrhea friendly diet.  levsin for abdominal cramping.  Return in 48 hours if sxs do not improve.  Pt voiced understanding.  Diarrhea, unspecified type - Plan: POCT CBC, POCT urinalysis dipstick, POCT Microscopic Urinalysis ([facility]), Hyoscyamine Sulfate SL (LEVSIN/SL) 0.125 MG SUBL  Abdominal pain, unspecified abdominal location - Plan: POCT CBC, POCT urinalysis dipstick, POCT Microscopic Urinalysis ([facility]), Hyoscyamine Sulfate SL (LEVSIN/SL) 0.125 MG SUBL  Trena PlattStephanie Hartleigh [place], PA-C [facility] 12/15/20177:10 PM

## 2016-08-31 NOTE — Patient Instructions (Addendum)
This is likely a gastroenterological virus.  I would like you to hydrate well.  Eat cleanly as listed below.  Take the medication as prescribed.  Avoid the anti-motility medicines.  Return if your symptoms do not improve within the next 3 days.  Food Choices to Help Relieve Diarrhea, Adult When you have diarrhea, the foods you eat and your eating habits are very important. Choosing the right foods and drinks can help relieve diarrhea. Also, because diarrhea can last up to 7 days, you need to replace lost fluids and electrolytes (such as sodium, potassium, and chloride) in order to help prevent dehydration. What general guidelines do I need to follow?  Slowly drink 1 cup (8 oz) of fluid for each episode of diarrhea. If you are getting enough fluid, your urine will be clear or pale yellow.  Eat starchy foods. Some good choices include white rice, white toast, pasta, low-fiber cereal, baked potatoes (without the skin), saltine crackers, and bagels.  Avoid large servings of any cooked vegetables.  Limit fruit to two servings per day. A serving is  cup or 1 small piece.  Choose foods with less than 2 g of fiber per serving.  Limit fats to less than 8 tsp (38 g) per day.  Avoid fried foods.  Eat foods that have probiotics in them. Probiotics can be found in certain dairy products.  Avoid foods and beverages that may increase the speed at which food moves through the stomach and intestines (gastrointestinal tract). Things to avoid include:  High-fiber foods, such as dried fruit, raw fruits and vegetables, nuts, seeds, and whole grain foods.  Spicy foods and high-fat foods.  Foods and beverages sweetened with high-fructose corn syrup, honey, or sugar alcohols such as xylitol, sorbitol, and mannitol. What foods are recommended? Grains  White rice. White, [place], or pita breads (fresh or toasted), including plain rolls, buns, or bagels. White pasta. Saltine, soda, or graham crackers. Pretzels.  Low-fiber cereal. Cooked cereals made with water (such as cornmeal, farina, or cream cereals). Plain muffins. Matzo. Melba toast. Zwieback. Vegetables  Potatoes (without the skin). Strained tomato and vegetable juices. Most well-cooked and canned vegetables without seeds. Tender lettuce. Fruits  Cooked or canned applesauce, apricots, cherries, fruit cocktail, grapefruit, peaches, pears, or plums. Fresh bananas, apples without skin, cherries, grapes, cantaloupe, grapefruit, peaches, oranges, or plums. Meat and Other Protein Products  Baked or boiled chicken. Eggs. Tofu. Fish. Seafood. Smooth peanut butter. Ground or well-cooked tender beef, ham, veal, lamb, pork, or poultry. Dairy  Plain yogurt, kefir, and unsweetened liquid yogurt. Lactose-free milk, buttermilk, or soy milk. Plain hard cheese. Beverages  Sport drinks. Clear broths. Diluted fruit juices (except prune). Regular, caffeine-free sodas such as ginger ale. Water. Decaffeinated teas. Oral rehydration solutions. Sugar-free beverages not sweetened with sugar alcohols. Other  Bouillon, broth, or soups made from recommended foods. The items listed above may not be a complete list of recommended foods or beverages. Contact your dietitian for more options.  What foods are not recommended? Grains  Whole grain, whole wheat, bran, or rye breads, rolls, pastas, crackers, and cereals. Wild or brown rice. Cereals that contain more than 2 g of fiber per serving. Corn tortillas or taco shells. Cooked or dry oatmeal. Granola. Popcorn. Vegetables  Raw vegetables. Cabbage, broccoli, Brussels sprouts, artichokes, baked beans, beet greens, corn, kale, legumes, peas, sweet potatoes, and yams. Potato skins. Cooked spinach and cabbage. Fruits  Dried fruit, including raisins and dates. Raw fruits. Stewed or dried prunes. Fresh apples with  skin, apricots, mangoes, pears, raspberries, and strawberries. Meat and Other Protein Products  Chunky peanut butter.  Nuts and seeds. Beans and lentils. Tomasa BlaseBacon. Dairy  High-fat cheeses. Milk, chocolate milk, and beverages made with milk, such as milk shakes. Cream. Ice cream. Sweets and Desserts  Sweet rolls, doughnuts, and sweet breads. Pancakes and waffles. Fats and Oils  Butter. Cream sauces. Margarine. Salad oils. Plain salad dressings. Olives. Avocados. Beverages  Caffeinated beverages (such as coffee, tea, soda, or energy drinks). Alcoholic beverages. Fruit juices with pulp. Prune juice. Soft drinks sweetened with high-fructose corn syrup or sugar alcohols. Other  Coconut. Hot sauce. Chili powder. Mayonnaise. Gravy. Cream-based or milk-based soups. The items listed above may not be a complete list of foods and beverages to avoid. Contact your dietitian for more information.  What should I do if I become dehydrated? Diarrhea can sometimes lead to dehydration. Signs of dehydration include dark urine and dry mouth and skin. If you think you are dehydrated, you should rehydrate with an oral rehydration solution. These solutions can be purchased at pharmacies, retail stores, or online. Drink -1 cup (120-240 mL) of oral rehydration solution each time you have an episode of diarrhea. If drinking this amount makes your diarrhea worse, try drinking smaller amounts more often. For example, drink 1-3 tsp (5-15 mL) every 5-10 minutes. A general rule for staying hydrated is to drink 1-2 L of fluid per day. Talk to your health care provider about the specific amount you should be drinking each day. Drink enough fluids to keep your urine clear or pale yellow. This information is not intended to replace advice given to you by your health care provider. Make sure you discuss any questions you have with your health care provider. Document Released: 11/24/2003 Document Revised: 02/09/2016 Document Reviewed: 07/27/2013 Elsevier Interactive Patient Education  2017 Elsevier Inc.  Viral Gastroenteritis,  Adult Introduction Viral gastroenteritis is also known as the stomach flu. This condition is caused by certain germs (viruses). These germs can be passed from person to person very easily (are very contagious). This condition can cause sudden watery poop (diarrhea), fever, and throwing up (vomiting). Having watery poop and throwing up can make you feel weak and cause you to get dehydrated. Dehydration can make you tired and thirsty, make you have a dry mouth, and make it so you pee (urinate) less often. Older adults and people with other diseases or a weak defense system (immune system) are at higher risk for dehydration. It is important to replace the fluids that you lose from having watery poop and throwing up. Follow these instructions at home: Follow instructions from your doctor about how to care for yourself at home. Eating and drinking Follow these instructions as told by your doctor:  Take an oral rehydration solution (ORS). This is a drink that is sold at pharmacies and stores.  Drink clear fluids in small amounts as you are able, such as:  Water.  Ice chips.  Diluted fruit juice.  Low-calorie sports drinks.  Eat bland, easy-to-digest foods in small amounts as you are able, such as:  Bananas.  Applesauce.  Rice.  Low-fat (lean) meats.  Toast.  Crackers.  Avoid fluids that have a lot of sugar or caffeine in them.  Avoid alcohol.  Avoid spicy or fatty foods. General instructions  Drink enough fluid to keep your pee (urine) clear or pale yellow.  Wash your hands often. If you cannot use soap and water, use hand sanitizer.  Make sure that all  people in your home wash their hands well and often.  Rest at home while you get better.  Take over-the-counter and prescription medicines only as told by your doctor.  Watch your condition for any changes.  Take a warm bath to help with any burning or pain from having watery poop.  Keep all follow-up visits as told  by your doctor. This is important. Contact a doctor if:  You cannot keep fluids down.  Your symptoms get worse.  You have new symptoms.  You feel light-headed or dizzy.  You have muscle cramps. Get help right away if:  You have chest pain.  You feel very weak or you pass out (faint).  You see blood in your throw-up.  Your throw-up looks like coffee grounds.  You have bloody or black poop (stools) or poop that look like tar.  You have a very bad headache, a stiff neck, or both.  You have a rash.  You have very bad pain, cramping, or bloating in your belly (abdomen).  You have trouble breathing.  You are breathing very quickly.  Your heart is beating very quickly.  Your skin feels cold and clammy.  You feel confused.  You have pain when you pee.  You have signs of dehydration, such as:  Dark pee, hardly any pee, or no pee.  Cracked lips.  Dry mouth.  Sunken eyes.  Sleepiness.  Weakness. This information is not intended to replace advice given to you by your health care provider. Make sure you discuss any questions you have with your health care provider. Document Released: 02/20/2008 Document Revised: 03/23/2016 Document Reviewed: 05/10/2015  2017 Elsevier     IF you received an x-ray today, you will receive an invoice from [facility]. Please contact [facility] at [phone] with questions or concerns regarding your invoice.   IF you received labwork today, you will receive an invoice from [facility]. Please contact [facility] at [phone] with questions or concerns regarding your invoice.   Our billing staff will not be able to assist you with questions regarding bills from these companies.  You will be contacted with the lab results as soon as they are available. The fastest way to get your results is to activate your My Chart account. Instructions are located on the last page of this paperwork. If you have not heard from us  regarding the results in 2 weeks, please contact this office.

## 2016-09-19 ENCOUNTER — Ambulatory Visit (INDEPENDENT_AMBULATORY_CARE_PROVIDER_SITE_OTHER): Payer: BC Managed Care – PPO | Admitting: Family Medicine

## 2016-09-19 VITALS — BP 132/78 | HR 106 | Temp 97.9°F | Resp 16 | Ht 73.5 in | Wt 207.8 lb

## 2016-09-19 DIAGNOSIS — R0989 Other specified symptoms and signs involving the circulatory and respiratory systems: Secondary | ICD-10-CM

## 2016-09-19 DIAGNOSIS — R05 Cough: Secondary | ICD-10-CM

## 2016-09-19 DIAGNOSIS — R21 Rash and other nonspecific skin eruption: Secondary | ICD-10-CM

## 2016-09-19 DIAGNOSIS — R062 Wheezing: Secondary | ICD-10-CM

## 2016-09-19 DIAGNOSIS — R059 Cough, unspecified: Secondary | ICD-10-CM

## 2016-09-19 MED ORDER — HYDROCOD POLST-CPM POLST ER 10-8 MG/5ML PO SUER
5.0000 mL | Freq: Two times a day (BID) | ORAL | 0 refills | Status: DC | PRN
Start: 2016-09-19 — End: 2016-11-06

## 2016-09-19 MED ORDER — CLOTRIMAZOLE-BETAMETHASONE 1-0.05 % EX CREA: 1.0000 "application " | TOPICAL_CREAM | Freq: Two times a day (BID) | CUTANEOUS | 0 refills | Status: DC

## 2016-09-19 MED ORDER — ALBUTEROL SULFATE HFA 108 (90 BASE) MCG/ACT IN AERS: 2.0000 | INHALATION_SPRAY | RESPIRATORY_TRACT | 1 refills | Status: DC | PRN

## 2016-09-19 MED ORDER — PREDNISONE 20 MG PO TABS: 40.0000 mg | ORAL_TABLET | Freq: Every day | ORAL | 0 refills | Status: AC

## 2016-09-19 MED ORDER — IPRATROPIUM BROMIDE 0.03 % NA SOLN: 2.0000 | Freq: Two times a day (BID) | NASAL | 0 refills | Status: DC

## 2016-09-19 NOTE — Patient Instructions (Addendum)
Prednisone 40 mg x 5 days, daily with breakfast.  Atrovent nasal spray, twice daily as needed for congestion.  Take Tussionex 5 ml every 12 hours as needed for cough.  Albuterol inhaler, 2 puffs, every 4-6 hours as needed for wheezing.   IF you received an x-ray today, you will receive an invoice from [facility]. Please contact [facility] at [phone] with questions or concerns regarding your invoice.   IF you received labwork today, you will receive an invoice from [facility]. Please contact [facility] at [phone] with questions or concerns regarding your invoice.   Our billing staff will not be able to assist you with questions regarding bills from these companies.  You will be contacted with the lab results as soon as they are available. The fastest way to get your results is to activate your My Chart account. Instructions are located on the last page of this paperwork. If you have not heard from us regarding the results in 2 weeks, please contact this office.     Bronchospasm, Adult A bronchospasm is a spasm or tightening of the airways going into the lungs. During a bronchospasm breathing becomes more difficult because the airways get smaller. When this happens there can be coughing, a whistling sound when breathing (wheezing), and difficulty breathing. Bronchospasm is often associated with asthma, but not all patients who experience a bronchospasm have asthma. What are the causes? A bronchospasm is caused by inflammation or irritation of the airways. The inflammation or irritation may be triggered by:  Allergies (such as to animals, pollen, food, or mold). Allergens that cause bronchospasm may cause wheezing immediately after exposure or many hours later.  Infection. Viral infections are believed to be the most common cause of bronchospasm.  Exercise.  Irritants (such as pollution, cigarette smoke, strong odors, aerosol sprays, and paint fumes).  Weather  changes. Winds increase molds and pollens in the air. Rain refreshes the air by washing irritants out. Cold air may cause inflammation.  Stress and emotional upset. What are the signs or symptoms?  Wheezing.  Excessive nighttime coughing.  Frequent or severe coughing with a simple cold.  Chest tightness.  Shortness of breath. How is this diagnosed? Bronchospasm is usually diagnosed through a history and physical exam. Tests, such as chest X-rays, are sometimes done to look for other conditions. How is this treated?  Inhaled medicines can be given to open up your airways and help you breathe. The medicines can be given using either an inhaler or a nebulizer machine.  Corticosteroid medicines may be given for severe bronchospasm, usually when it is associated with asthma. Follow these instructions at home:  Always have a plan prepared for seeking medical care. Know when to call your health care provider and local emergency services (911 in the [place] Know where you can access local emergency care.  Only take medicines as directed by your health care provider.  If you were prescribed an inhaler or nebulizer machine, ask your health care provider to explain how to use it correctly. Always use a spacer with your inhaler if you were given one.  It is necessary to remain calm during an attack. Try to relax and breathe more slowly.  Control your home environment in the following ways:  Change your heating and air conditioning filter at least once a month.  Limit your use of fireplaces and wood stoves.  Do not smoke and do not allow smoking in your home.  Avoid exposure to perfumes and fragrances.  Get  rid of pests (such as roaches and mice) and their droppings.  Throw away plants if you see mold on them.  Keep your house clean and dust free.  Replace carpet with wood, tile, or vinyl flooring. Carpet can trap dander and dust.  Use allergy-proof pillows, mattress covers, and  box spring covers.  Wash bed sheets and blankets every week in hot water and dry them in a dryer.  Use blankets that are made of polyester or cotton.  Wash hands frequently. Contact a health care provider if:  You have muscle aches.  You have chest pain.  The sputum changes from clear or white to yellow, green, gray, or bloody.  The sputum you cough up gets thicker.  There are problems that may be related to the medicine you are given, such as a rash, itching, swelling, or trouble breathing. Get help right away if:  You have worsening wheezing and coughing even after taking your prescribed medicines.  You have increased difficulty breathing.  You develop severe chest pain. This information is not intended to replace advice given to you by your health care provider. Make sure you discuss any questions you have with your health care provider. Document Released: 09/06/2003 Document Revised: 02/09/2016 Document Reviewed: 02/23/2013 Elsevier Interactive Patient Education  2017 [employer].

## 2016-09-19 NOTE — Progress Notes (Signed)
Patient ID: Guy Walton, male    DOB: 04/21/1991, 26 y.o.   MRN: [id number]  PCP: Sissy Hoff, MD  Chief Complaint  Patient presents with  . Cough    greenish brown plegm, hurts to breathe and when coughing  3days , nasal congestion greenish yellow mucus    Subjective:  HPI 26 year old male presents for evaluation of cough times 3 days. Hx of childhood asthma. He reports little wheezing with deep breaths and when going in and out cold temperatures. 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 Reports mild fatigue, denies fever, nausea, or vomiting.   Social History   Social History  . Marital status: Significant Other    Spouse name: n/a  . Number of children: 0  . Years of education: N/A   Occupational History  . 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 Smoking status: Never Smoker  . Smokeless tobacco: Never Used  . Alcohol use No  . Drug use: No  . Sexual activity: Yes    Partners: Female   Other Topics Concern  . Not on file   Social History Narrative   Lives with his grandparents.   He works at [employer] in [employer] center   [employer] of education:  H.S., starting courses at [employer]    Family History  Problem Relation Age of Onset  . Hypercholesterolemia Paternal Grandfather   . Hypertension Paternal Grandfather   . Hypothyroidism Paternal Grandmother   . Hypertension Paternal Grandmother   . Healthy Brother   . Healthy Sister    Review of Systems See HPI Patient Active Problem List   Diagnosis Date Noted  . Left sided sciatica 09/23/2014    No Known Allergies  Prior to Admission medications   Medication Sig Start Date End Date Taking? Authorizing Provider  gabapentin (NEURONTIN) 300 MG capsule Take 1-2 tablets at bedtime as  needed 02/28/16  Yes Guinevere Scarlet, MD  methylphenidate (CONCERTA) 18 MG PO CR tablet  12/09/14  Yes Historical Provider, MD  sertraline (ZOLOFT) 50 MG tablet  01/10/15  Yes Historical Provider, MD  Hyoscyamine Sulfate SL (LEVSIN/SL) 0.125 MG SUBL Place 0.125 mg under the tongue every 4 (four) hours as needed. Patient not taking: Reported on 09/19/2016 08/31/16   Garnetta Buddy, PA    Past Medical, Surgical Family and Social History reviewed and updated.    Objective:   Today's Vitals   09/19/16 1518  BP: 132/78  Pulse: (!) 106  Resp: 16  Temp: 97.9 F (36.6 C)  TempSrc: Oral  SpO2: 96%  Weight: 207 lb 12.8 oz (94.3 kg)  Height: 6' 1.5" (1.867 m)    Wt Readings from Last 3 Encounters:  09/19/16 207 lb 12.8 oz (94.3 kg)  08/31/16 205 lb (93 kg)  08/07/16 208 lb (94.3 kg)   Physical Exam  Constitutional: He is oriented to person, place, and time. He appears well-developed and well-nourished.  HENT:  Head: Normocephalic and atraumatic.  Right Ear: Hearing, tympanic membrane, external ear and ear canal normal.  Left Ear: Hearing, tympanic membrane, external ear and ear canal normal.  Nose: Mucosal edema and rhinorrhea present.  Mouth/Throat: Oropharynx is clear and moist.    Neck: Normal range of motion. Neck supple.  Cardiovascular: Normal rate, regular  rhythm, normal heart sounds and intact distal pulses.   Pulmonary/Chest: Effort normal and breath sounds normal.  Neurological: He is alert and oriented to person, place, and time.  Skin: Skin is warm and dry.  Psychiatric: He has a normal mood and affect. His behavior is normal. Judgment and thought content normal.      Assessment & Plan:  1. Cough 2. Chest congestion 3. Wheezing  Plan: -Tussionex 5 ml twice daily for cough. Causes drowsiness, avoid driving while taking medication.  -Prednisone 40 mg once daily x 5 days.  -Albuterol inhaler, 2 puffs, every 4-6 hours as needed for shortness of breath.    -Atrovent nasal spray, 2 sprays, twice daily .  4. Rash and nonspecific skin eruption  Plan:  -clotrimazole-betamethasone (Lotrisone) cream, 1 application, 2 times daily    Return for follow-up if rash or wheezing worsens or doesn't improve.  Godfrey PickKimberly S. Tiburcio PeaHarris, MSN, FNP-C [facility]

## 2016-09-21 ENCOUNTER — Telehealth: Payer: Self-pay

## 2016-09-21 NOTE — Telephone Encounter (Addendum)
PATIENT STATES HE SAW KIMBERLY HARRIS ON Wednesday FOR A COUGH AND CONGESTION. SHE TOLD HIM TO CALL HER BACK IF HE DID NOT START TO FEEL BETTER BY TODAY (FRIDAY) HE WANTS HER TO KNOW THAT HIS COUGH AND THROAT IS A LITTLE WORSE. HE SAID SHE WOULD CALL HIM SOMETHING ELSE INTO HIS PHARMACY. HE ALSO HAS A HEADACHE AND HE GOT CHILLS LAST NIGHT. HE NEEDS TO GET A NOTE FOR WORK PUTTING HIM OUT TODAY 09/21/16 AND RETURNING ON Sunday 09/23/16. HE SAID HE IS OFF Saturday 09/22/16. BEST PHONE [phone] (CELL) PHARMACY CHOICE IS [facility] ON [address]. MBC

## 2016-09-22 ENCOUNTER — Telehealth: Payer: Self-pay

## 2016-09-22 MED ORDER — PSEUDOEPH-BROMPHEN-DM 30-2-10 MG/5ML PO SYRP: 5.0000 mL | ORAL_SOLUTION | Freq: Four times a day (QID) | ORAL | 0 refills | Status: DC | PRN

## 2016-09-22 NOTE — Telephone Encounter (Signed)
See next note

## 2016-09-22 NOTE — Telephone Encounter (Signed)
See prior note too Pt walked in stating he needed a note for work for Friday too and was told to call if not better for an antibiotic? Nothing in notes re this. I offered him an appt. Today, he refused. I gave him a note and told him id send this note to kim for Monday for next step? He states his cough has not improved, no new symptoms.

## 2016-09-22 NOTE — Telephone Encounter (Signed)
Pt is checking on status of the medications being sent to the pharmacy from being seen on 09/19/16    And needs a note for work   Best number [phone]

## 2016-09-22 NOTE — Telephone Encounter (Signed)
Please advise patient that his cough could persist for up to 2 weeks. As long as he is not wheezing, having a fever, or shortness of breath, he should continue current regimen. I will also send him over some cough syrup that he can take during the day to help manage the cough. Cough is a symptoms of viral illness and takes the longest to resolve.  Thanks,  Godfrey PickKimberly S. Tiburcio PeaHarris, MSN, FNP-C [facility] [phone]

## 2016-09-24 NOTE — Telephone Encounter (Signed)
L/m with kims note 

## 2016-09-27 ENCOUNTER — Ambulatory Visit (INDEPENDENT_AMBULATORY_CARE_PROVIDER_SITE_OTHER): Payer: BC Managed Care – PPO | Admitting: Emergency Medicine

## 2016-09-27 VITALS — BP 128/78 | HR 94 | Temp 97.8°F | Resp 16 | Ht 73.0 in | Wt 207.0 lb

## 2016-09-27 DIAGNOSIS — J069 Acute upper respiratory infection, unspecified: Secondary | ICD-10-CM | POA: Diagnosis not present

## 2016-09-27 DIAGNOSIS — R0989 Other specified symptoms and signs involving the circulatory and respiratory systems: Secondary | ICD-10-CM | POA: Diagnosis not present

## 2016-09-27 DIAGNOSIS — R05 Cough: Secondary | ICD-10-CM

## 2016-09-27 DIAGNOSIS — R059 Cough, unspecified: Secondary | ICD-10-CM

## 2016-09-27 MED ORDER — IBUPROFEN 800 MG PO TABS: 800.0000 mg | ORAL_TABLET | Freq: Three times a day (TID) | ORAL | 0 refills | Status: DC | PRN

## 2016-09-27 NOTE — Progress Notes (Signed)
Guy FiedlerDaniel Walton 26 y.o.   Chief Complaint  Patient presents with  . Follow-up    cough,slight body aches    HISTORY OF PRESENT ILLNESS: This is a 26 y.o. male complaining of flu-like symptoms x 1 week; seen here last Saturday; feels better but states he still needs to rest 1 more day; planning on returning to work tomorrow.  HPI   Prior to Admission medications   Medication Sig Start Date End Date Taking? Authorizing Provider  brompheniramine-pseudoephedrine-DM 30-2-10 MG/5ML syrup Take 5 mLs by mouth 4 (four) times daily as needed. 09/22/16  Yes Doyle AskewKimberly [name], FNP  gabapentin (NEURONTIN) 300 MG capsule Take 1-2 tablets at bedtime as needed 02/28/16  Yes Guinevere ScarletBlake Williams, MD  ipratropium (ATROVENT) 0.03 % nasal spray Place 2 sprays into both nostrils 2 (two) times daily. 09/19/16  Yes Doyle AskewKimberly [name], FNP  methylphenidate (CONCERTA) 18 MG PO CR tablet  12/09/14  Yes Historical Provider, MD  sertraline (ZOLOFT) 50 MG tablet  01/10/15  Yes Historical Provider, MD  albuterol (PROVENTIL HFA;VENTOLIN HFA) 108 (90 Base) MCG/ACT inhaler Inhale 2 puffs into the lungs every 4 (four) hours as needed for wheezing or shortness of breath (cough, shortness of breath or wheezing.). Patient not taking: Reported on 09/27/2016 09/19/16   Doyle AskewKimberly [name], FNP  chlorpheniramine-HYDROcodone [facility](TUSSIONEX PENNKINETIC ER) 10-8 MG/5ML SUER Take 5 mLs by mouth every 12 (twelve) hours as needed for cough. Patient not taking: Reported on 09/27/2016 09/19/16   Doyle AskewKimberly [name], FNP  clotrimazole-betamethasone (LOTRISONE) cream Apply 1 application topically 2 (two) times daily. Patient not taking: Reported on 09/27/2016 09/19/16   Doyle AskewKimberly [name], FNP  Hyoscyamine Sulfate SL (LEVSIN/SL) 0.125 MG SUBL Place 0.125 mg under the tongue every 4 (four) hours as needed. Patient not taking: Reported on 09/27/2016 08/31/16   Collie SiadStephanie D English, PA  ibuprofen (ADVIL,MOTRIN) 800 MG tablet Take  1 tablet (800 mg total) by mouth every 8 (eight) hours as needed. 09/27/16   Shakevia Sarris Victorino DecemberJose Tandrea Kommer, MD    No Known Allergies  Patient Active Problem List   Diagnosis Date Noted  . Left sided sciatica 09/23/2014    Past Medical History:  Diagnosis Date  . ADHD (attention deficit hyperactivity disorder)   . Chronic low back pain   . Tourette's    questionable per family    Past Surgical History:  Procedure Laterality Date  . NO PAST SURGERIES      Social History   Social History  . Marital status: Significant Other    Spouse name: n/a  . Number of children: 0  . Years of education: N/A   Occupational History  . Student [name]; physical therapy assistant program  . freezer selector     [name]   Social History Main Topics  . Smoking status: Never Smoker  . Smokeless tobacco: Never Used  . Alcohol use No  . Drug use: No  . Sexual activity: Yes    Partners: Female   Other Topics Concern  . Not on file   Social History Narrative   Lives with his grandparents.   He works at [employer] in [employer]   [employer] level of education:  H.S., starting courses at [employer]    Family History  Problem Relation Age of Onset  . Hypercholesterolemia Paternal Grandfather   . Hypertension Paternal Grandfather   . Hypothyroidism Paternal Grandmother   . Hypertension Paternal Grandmother   . Healthy Brother   .  Healthy Sister      Review of Systems  Constitutional: Negative for chills and fever.  HENT: Positive for congestion. Negative for ear discharge, nosebleeds, sinus pain and sore throat.   Eyes: Negative for discharge and redness.  Respiratory: Positive for cough. Negative for shortness of breath.   Cardiovascular: Negative for chest pain and palpitations.  Gastrointestinal: Negative for abdominal pain, diarrhea, nausea and vomiting.  Genitourinary: Negative.   Musculoskeletal: Negative.   Skin: Negative  for rash.  Neurological: Negative.   Endo/Heme/Allergies: Negative.   Psychiatric/Behavioral: Negative.   All other systems reviewed and are negative.  Vitals:   09/27/16 1152  BP: 128/78  Pulse: 94  Resp: 16  Temp: 97.8 F (36.6 C)     Physical Exam  Constitutional: He appears well-developed and well-nourished.  HENT:  Head: Normocephalic and atraumatic.  Right Ear: External ear normal.  Left Ear: External ear normal.  Nose: Nose normal.  Mouth/Throat: Oropharynx is clear and moist.  Eyes: Conjunctivae and EOM are normal. Pupils are equal, round, and reactive to light.  Neck: Normal range of motion. Neck supple.  Cardiovascular: Normal rate, regular rhythm, normal heart sounds and intact distal pulses.   Pulmonary/Chest: Effort normal and breath sounds normal.  Abdominal: Soft. Bowel sounds are normal.     ASSESSMENT & PLAN: Antoney was seen today for follow-up.  Diagnoses and all orders for this visit:  Acute upper respiratory infection -     Care order/instruction:  Cough  Chest congestion  Other orders -     ibuprofen (ADVIL,MOTRIN) 800 MG tablet; Take 1 tablet (800 mg total) by mouth every 8 (eight) hours as needed.    Patient Instructions  Upper Respiratory Infection, Adult Most upper respiratory infections (URIs) are caused by a virus. A URI affects the nose, throat, and upper air passages. The most common type of URI is often called "the common cold." Follow these instructions at home:  Take medicines only as told by your doctor.  Gargle warm saltwater or take cough drops to comfort your throat as told by your doctor.  Use a warm mist humidifier or inhale steam from a shower to increase air moisture. This may make it easier to breathe.  Drink enough fluid to keep your pee (urine) clear or pale yellow.  Eat soups and other clear broths.  Have a healthy diet.  Rest as needed.  Go back to work when your fever is gone or your doctor says it is  okay.  You may need to stay home longer to avoid giving your URI to others.  You can also wear a face mask and wash your hands often to prevent spread of the virus.  Use your inhaler more if you have asthma.  Do not use any tobacco products, including cigarettes, chewing tobacco, or electronic cigarettes. If you need help quitting, ask your doctor. Contact a doctor if:  You are getting worse, not better.  Your symptoms are not helped by medicine.  You have chills.  You are getting more short of breath.  You have brown or red mucus.  You have yellow or brown discharge from your nose.  You have pain in your face, especially when you bend forward.  You have a fever.  You have puffy (swollen) neck glands.  You have pain while swallowing.  You have white areas in the back of your throat. Get help right away if:  You have very bad or constant:  Headache.  Ear pain.  Pain  in your forehead, behind your eyes, and over your cheekbones (sinus pain).  Chest pain.  You have long-lasting (chronic) lung disease and any of the following:  Wheezing.  Long-lasting cough.  Coughing up blood.  A change in your usual mucus.  You have a stiff neck.  You have changes in your:  Vision.  Hearing.  Thinking.  Mood. This information is not intended to replace advice given to you by your health care provider. Make sure you discuss any questions you have with your health care provider. Document Released: 02/20/2008 Document Revised: 05/06/2016 Document Reviewed: 12/09/2013 Elsevier Interactive Patient Education  2017 Elsevier Inc.      Edwina Barth, MD [facility]

## 2016-09-27 NOTE — Patient Instructions (Signed)
Upper Respiratory Infection, Adult Most upper respiratory infections (URIs) are caused by a virus. A URI affects the nose, throat, and upper air passages. The most common type of URI is often called "the common cold." Follow these instructions at home:  Take medicines only as told by your doctor.  Gargle warm saltwater or take cough drops to comfort your throat as told by your doctor.  Use a warm mist humidifier or inhale steam from a shower to increase air moisture. This may make it easier to breathe.  Drink enough fluid to keep your pee (urine) clear or pale yellow.  Eat soups and other clear broths.  Have a healthy diet.  Rest as needed.  Go back to work when your fever is gone or your doctor says it is okay.  You may need to stay home longer to avoid giving your URI to others.  You can also wear a face mask and wash your hands often to prevent spread of the virus.  Use your inhaler more if you have asthma.  Do not use any tobacco products, including cigarettes, chewing tobacco, or electronic cigarettes. If you need help quitting, ask your doctor. Contact a doctor if:  You are getting worse, not better.  Your symptoms are not helped by medicine.  You have chills.  You are getting more short of breath.  You have brown or red mucus.  You have yellow or brown discharge from your nose.  You have pain in your face, especially when you bend forward.  You have a fever.  You have puffy (swollen) neck glands.  You have pain while swallowing.  You have white areas in the back of your throat. Get help right away if:  You have very bad or constant:  Headache.  Ear pain.  Pain in your forehead, behind your eyes, and over your cheekbones (sinus pain).  Chest pain.  You have long-lasting (chronic) lung disease and any of the following:  Wheezing.  Long-lasting cough.  Coughing up blood.  A change in your usual mucus.  You have a stiff neck.  You have  changes in your:  Vision.  Hearing.  Thinking.  Mood. This information is not intended to replace advice given to you by your health care provider. Make sure you discuss any questions you have with your health care provider. Document Released: 02/20/2008 Document Revised: 05/06/2016 Document Reviewed: 12/09/2013 Elsevier Interactive Patient Education  2017 Elsevier Inc.  

## 2016-11-06 ENCOUNTER — Ambulatory Visit (INDEPENDENT_AMBULATORY_CARE_PROVIDER_SITE_OTHER): Payer: BC Managed Care – PPO | Admitting: Physician Assistant

## 2016-11-06 VITALS — BP 122/76 | HR 78 | Temp 98.2°F | Resp 16 | Ht 73.5 in | Wt 211.0 lb

## 2016-11-06 DIAGNOSIS — Z113 Encounter for screening for infections with a predominantly sexual mode of transmission: Secondary | ICD-10-CM | POA: Diagnosis not present

## 2016-11-06 LAB — POCT URINALYSIS DIP (MANUAL ENTRY)
BILIRUBIN UA: NEGATIVE
Blood, UA: NEGATIVE
GLUCOSE UA: NEGATIVE
Ketones, POC UA: NEGATIVE
Leukocytes, UA: NEGATIVE
Nitrite, UA: NEGATIVE
Protein Ur, POC: NEGATIVE
SPEC GRAV UA: 1.025
UROBILINOGEN UA: 0.2
pH, UA: 6.5

## 2016-11-06 NOTE — Progress Notes (Signed)
11/06/2016 5:25 PM   DOB: 1990/10/12 / MRN: [id number]  SUBJECTIVE:  Guy Walton is a 26 y.o. male presenting for STI screening. He has a history of chlamydia 4 years ago.  Complains of "tingling" after ejaculations.  Denies blatant exposure.  No dysuria, testicular, perineum, urinary hesitancy, urinary urgency. He is sexually active with females. No new partners at this time.   He has No Known Allergies.   He  has a past medical history of ADHD (attention deficit hyperactivity disorder); Chronic low back pain; and Tourette's.    He  reports that he has never smoked. He has never used smokeless tobacco. He reports that he does not drink alcohol or use drugs. He  reports that he currently engages in sexual activity and has had male partners. The patient  has a past surgical history that includes No past surgeries.  His family history includes Healthy in his brother and sister; Hypercholesterolemia in his paternal grandfather; Hypertension in his paternal grandfather and paternal grandmother; Hypothyroidism in his paternal grandmother.  Review of Systems  Constitutional: Negative for chills and fever.  Gastrointestinal: Negative for nausea.  Skin: Negative for itching and rash.    The problem list and medications were reviewed and updated by myself where necessary and exist elsewhere in the encounter.   OBJECTIVE:  BP 122/76 (Cuff Size: Normal)   Pulse 78   Temp 98.2 F (36.8 C) (Oral)   Resp 16   Ht 6' 1.5" (1.867 m)   Wt 211 lb (95.7 kg)   SpO2 98%   BMI 27.46 kg/m   Physical Exam  Cardiovascular: Normal rate.   Pulmonary/Chest: Effort normal.  Abdominal: Hernia confirmed negative in the right inguinal area and confirmed negative in the left inguinal area.  Genitourinary: Testes normal and penis normal. Right testis shows no mass and no tenderness. Left testis shows no mass and no tenderness. Circumcised. No penile tenderness. No discharge found.  Lymphadenopathy:   Right: No inguinal adenopathy present.       Left: No inguinal adenopathy present.    Results for orders placed or performed in visit on 11/06/16 (from the past 72 hour(s))  POCT urinalysis dipstick     Status: None   Collection Time: 11/06/16  5:02 PM  Result Value Ref Range   Color, UA yellow yellow   Clarity, UA clear clear   Glucose, UA negative negative   Bilirubin, UA negative negative   Ketones, POC UA negative negative   Spec Grav, UA 1.025    Blood, UA negative negative   pH, UA 6.5    Protein Ur, POC negative negative   Urobilinogen, UA 0.2    Nitrite, UA Negative Negative   Leukocytes, UA Negative Negative    No results found.  ASSESSMENT AND PLAN:  Guy Walton was seen today for exposure to std.  Diagnoses and all orders for this visit:  Routine screening for STI (sexually transmitted infection): No exposure per HPI.  Will screen.  He declines HIV and RPR today but will come back for these if he has any positives. -     Trichomonas vaginalis, RNA -     GC/Chlamydia Probe Amp -     POCT urinalysis dipstick    The patient is advised to call or return to clinic if he does not see an improvement in symptoms, or to seek the care of the closest emergency department if he worsens with the above plan.   Deliah BostonMichael Kamile Fassler, MHS, PA-C [facility] 11/06/2016 5:25 PM

## 2016-11-06 NOTE — Patient Instructions (Signed)
     IF you received an x-ray today, you will receive an invoice from [facility]. Please contact [facility] at [phone] with questions or concerns regarding your invoice.   IF you received labwork today, you will receive an invoice from [facility]. Please contact [facility] at [phone] with questions or concerns regarding your invoice.   Our billing staff will not be able to assist you with questions regarding bills from these companies.  You will be contacted with the lab results as soon as they are available. The fastest way to get your results is to activate your My Chart account. Instructions are located on the last page of this paperwork. If you have not heard from us regarding the results in 2 weeks, please contact this office.     

## 2016-11-07 ENCOUNTER — Ambulatory Visit: Payer: BC Managed Care – PPO

## 2016-11-08 ENCOUNTER — Telehealth: Payer: Self-pay

## 2016-11-08 LAB — GC/CHLAMYDIA PROBE AMP
Chlamydia trachomatis, NAA: NEGATIVE
Neisseria gonorrhoeae by PCR: NEGATIVE

## 2016-11-08 LAB — TRICHOMONAS VAGINALIS, PROBE AMP: TRICH VAG BY NAA: NEGATIVE

## 2016-11-08 NOTE — Telephone Encounter (Signed)
Pt calling about lab results and transferred to renay and she advised him letter was sent in the mail and that his results were negative

## 2016-11-13 ENCOUNTER — Ambulatory Visit (INDEPENDENT_AMBULATORY_CARE_PROVIDER_SITE_OTHER): Payer: BC Managed Care – PPO | Admitting: Physician Assistant

## 2016-11-13 VITALS — BP 118/66 | HR 82 | Temp 98.6°F | Resp 16 | Ht 73.5 in | Wt 210.0 lb

## 2016-11-13 DIAGNOSIS — J069 Acute upper respiratory infection, unspecified: Secondary | ICD-10-CM

## 2016-11-13 DIAGNOSIS — K130 Diseases of lips: Secondary | ICD-10-CM

## 2016-11-13 DIAGNOSIS — B9789 Other viral agents as the cause of diseases classified elsewhere: Secondary | ICD-10-CM

## 2016-11-13 MED ORDER — IBUPROFEN 600 MG PO TABS: 600.0000 mg | ORAL_TABLET | Freq: Three times a day (TID) | ORAL | 0 refills | Status: DC | PRN

## 2016-11-13 MED ORDER — FLUCONAZOLE 150 MG PO TABS: 150.0000 mg | ORAL_TABLET | ORAL | 0 refills | Status: AC

## 2016-11-13 NOTE — Patient Instructions (Signed)
     IF you received an x-ray today, you will receive an invoice from [facility]. Please contact [facility] at [phone] with questions or concerns regarding your invoice.   IF you received labwork today, you will receive an invoice from [facility]. Please contact [facility] at [phone] with questions or concerns regarding your invoice.   Our billing staff will not be able to assist you with questions regarding bills from these companies.  You will be contacted with the lab results as soon as they are available. The fastest way to get your results is to activate your My Chart account. Instructions are located on the last page of this paperwork. If you have not heard from us regarding the results in 2 weeks, please contact this office.     

## 2016-11-13 NOTE — Progress Notes (Signed)
11/13/2016 3:32 PM   DOB: 1991/08/21 / MRN: [id number]  SUBJECTIVE:  Guy Walton is a 26 y.o. male presenting for sore throat and nasal congestion.  Associates frontal sinus HA and mild sneezing. No eye itching or watering. No mouth itching. No ear itching. This has been present for two days. Did have some mild chills last night. He did not get the flu shot.  Has noticed some muscle aches in his back and hips. Denies cough and chest congestion.  Complains of dry skin about the corners of his lips. This has been present "for years."  Has tried all types of creams and vaseline without relief. Has never tried treating a fungal etiology that he knows of. Denies alcohol use today and has no problems with his liver to his knowledge.    There is no immunization history on file for this patient.  He has No Known Allergies.   He  has a past medical history of ADHD (attention deficit hyperactivity disorder); Chronic low back pain; and Tourette's.    He  reports that he has never smoked. He has never used smokeless tobacco. He reports that he does not drink alcohol or use drugs. He  reports that he currently engages in sexual activity and has had male partners. The patient  has a past surgical history that includes No past surgeries.  His family history includes Healthy in his brother and sister; Hypercholesterolemia in his paternal grandfather; Hypertension in his paternal grandfather and paternal grandmother; Hypothyroidism in his paternal grandmother.  Review of Systems  Constitutional: Positive for malaise/fatigue. Negative for chills, diaphoresis and fever.  HENT: Positive for congestion and sore throat.   Respiratory: Positive for cough. Negative for hemoptysis, shortness of breath and wheezing.   Cardiovascular: Negative for chest pain.  Gastrointestinal: Negative for nausea.  Skin: Negative for rash.  Neurological: Negative for dizziness and weakness.  Endo/Heme/Allergies: Negative for  polydipsia.    The problem list and medications were reviewed and updated by myself where necessary and exist elsewhere in the encounter.   OBJECTIVE:  BP 118/66   Pulse 82   Temp 98.6 F (37 C) (Oral)   Resp 16   Ht 6' 1.5" (1.867 m)   Wt 210 lb (95.3 kg)   SpO2 96%   BMI 27.33 kg/m   Physical Exam  Constitutional: He is oriented to person, place, and time. He appears well-developed. He does not appear ill.  HENT:  Right Ear: Tympanic membrane normal.  Left Ear: Tympanic membrane normal.  Nose: Nose normal.  Mouth/Throat: Uvula is midline, oropharynx is clear and moist and mucous membranes are normal.    Eyes: Conjunctivae and EOM are normal. Pupils are equal, round, and reactive to light. No scleral icterus.  Cardiovascular: Normal rate and regular rhythm.   Pulmonary/Chest: Effort normal and breath sounds normal.  Abdominal: He exhibits no distension.  Musculoskeletal: Normal range of motion.  Neurological: He is alert and oriented to person, place, and time. No cranial nerve deficit. Coordination normal.  Skin: Skin is warm and dry. He is not diaphoretic.  Psychiatric: He has a normal mood and affect.  Nursing note and vitals reviewed.     No results found for this or any previous visit (from the past 72 hour(s)).  No results found.  ASSESSMENT AND PLAN:  Guy Walton was seen today for nasal congestion, sore throat and sinusitis.  Diagnoses and all orders for this visit:  Viral URI: No red flags. Will treat symptomatically.  -  ibuprofen (ADVIL,MOTRIN) 600 MG tablet; Take 1 tablet (600 mg total) by mouth every 8 (eight) hours as needed.  Angular cheilitis -     fluconazole (DIFLUCAN) 150 MG tablet; Take 1 tablet (150 mg total) by mouth once a week. Repeat if needed    The patient is advised to call or return to clinic if he does not see an improvement in symptoms, or to seek the care of the closest emergency department if he worsens with the above plan.    Deliah BostonMichael Amol Domanski, MHS, PA-C [facility] 11/13/2016 3:32 PM

## 2016-12-20 ENCOUNTER — Ambulatory Visit (INDEPENDENT_AMBULATORY_CARE_PROVIDER_SITE_OTHER): Payer: BC Managed Care – PPO | Admitting: Emergency Medicine

## 2016-12-20 VITALS — BP 116/65 | HR 83 | Temp 98.1°F | Resp 16 | Ht 73.5 in | Wt 209.6 lb

## 2016-12-20 DIAGNOSIS — R197 Diarrhea, unspecified: Secondary | ICD-10-CM

## 2016-12-20 DIAGNOSIS — R1084 Generalized abdominal pain: Secondary | ICD-10-CM | POA: Diagnosis not present

## 2016-12-20 DIAGNOSIS — R112 Nausea with vomiting, unspecified: Secondary | ICD-10-CM | POA: Diagnosis not present

## 2016-12-20 DIAGNOSIS — K529 Noninfective gastroenteritis and colitis, unspecified: Secondary | ICD-10-CM

## 2016-12-20 MED ORDER — ONDANSETRON HCL 4 MG PO TABS: 4.0000 mg | ORAL_TABLET | Freq: Three times a day (TID) | ORAL | 0 refills | Status: DC | PRN

## 2016-12-20 NOTE — Patient Instructions (Addendum)
   IF you received an x-ray today, you will receive an invoice from [facility]. Please contact [facility] at [phone] with questions or concerns regarding your invoice.   IF you received labwork today, you will receive an invoice from [facility]. Please contact [facility] at [phone] with questions or concerns regarding your invoice.   Our billing staff will not be able to assist you with questions regarding bills from these companies.  You will be contacted with the lab results as soon as they are available. The fastest way to get your results is to activate your My Chart account. Instructions are located on the last page of this paperwork. If you have not heard from us regarding the results in 2 weeks, please contact this office.      Food Choices to Help Relieve Diarrhea, Adult When you have diarrhea, the foods you eat and your eating habits are very important. Choosing the right foods and drinks can help:  Relieve diarrhea.  Replace lost fluids and nutrients.  Prevent dehydration.  What general guidelines should I follow? Relieving diarrhea  Choose foods with less than 2 g or .07 oz. of fiber per serving.  Limit fats to less than 8 tsp (38 g or 1.34 oz.) a day.  Avoid the following: ? Foods and beverages sweetened with high-fructose corn syrup, honey, or sugar alcohols such as xylitol, sorbitol, and mannitol. ? Foods that contain a lot of fat or sugar. ? Fried, greasy, or spicy foods. ? High-fiber grains, breads, and cereals. ? Raw fruits and vegetables.  Eat foods that are rich in probiotics. These foods include dairy products such as yogurt and fermented milk products. They help increase healthy bacteria in the stomach and intestines (gastrointestinal tract, or GI tract).  If you have lactose intolerance, avoid dairy products. These may make your diarrhea worse.  Take medicine to help stop diarrhea (antidiarrheal medicine) only as told by your  health care provider. Replacing nutrients  Eat small meals or snacks every 3-4 hours.  Eat bland foods, such as white rice, toast, or baked potato, until your diarrhea starts to get better. Gradually reintroduce nutrient-rich foods as tolerated or as told by your health care provider. This includes: ? Well-cooked protein foods. ? Peeled, seeded, and soft-cooked fruits and vegetables. ? Low-fat dairy products.  Take vitamin and mineral supplements as told by your health care provider. Preventing dehydration   Start by sipping water or a special solution to prevent dehydration (oral rehydration solution, ORS). Urine that is clear or pale yellow means that you are getting enough fluid.  Try to drink at least 8-10 cups of fluid each day to help replace lost fluids.  You may add other liquids in addition to water, such as clear juice or decaffeinated sports drinks, as tolerated or as told by your health care provider.  Avoid drinks with caffeine, such as coffee, tea, or soft drinks.  Avoid alcohol. What foods are recommended? The items listed may not be a complete list. Talk with your health care provider about what dietary choices are best for you. Grains White rice. White, French, or pita breads (fresh or toasted), including plain rolls, buns, or bagels. White pasta. Saltine, soda, or graham crackers. Pretzels. Low-fiber cereal. Cooked cereals made with water (such as cornmeal, farina, or cream cereals). Plain muffins. Matzo. Melba toast. Zwieback. Vegetables Potatoes (without the skin). Most well-cooked and canned vegetables without skins or seeds. Tender lettuce. Fruits Apple sauce. Fruits canned in juice. Cooked apricots,   cherries, grapefruit, peaches, pears, or plums. Fresh bananas and cantaloupe. Meats and other protein foods Baked or boiled chicken. Eggs. Tofu. Fish. Seafood. Smooth nut butters. Ground or well-cooked tender beef, ham, veal, lamb, pork, or poultry. Dairy Plain  yogurt, kefir, and unsweetened liquid yogurt. Lactose-free milk, buttermilk, skim milk, or soy milk. Low-fat or nonfat hard cheese. Beverages Water. Low-calorie sports drinks. Fruit juices without pulp. Strained tomato and vegetable juices. Decaffeinated teas. Sugar-free beverages not sweetened with sugar alcohols. Oral rehydration solutions, if approved by your health care provider. Seasoning and other foods Bouillon, broth, or soups made from recommended foods. What foods are not recommended? The items listed may not be a complete list. Talk with your health care provider about what dietary choices are best for you. Grains Whole grain, whole wheat, bran, or rye breads, rolls, pastas, and crackers. Wild or brown rice. Whole grain or bran cereals. Barley. Oats and oatmeal. Corn tortillas or taco shells. Granola. Popcorn. Vegetables Raw vegetables. Fried vegetables. Cabbage, broccoli, Brussels sprouts, artichokes, baked beans, beet greens, corn, kale, legumes, peas, sweet potatoes, and yams. Potato skins. Cooked spinach and cabbage. Fruits Dried fruit, including raisins and dates. Raw fruits. Stewed or dried prunes. Canned fruits with syrup. Meat and other protein foods Fried or fatty meats. Deli meats. Chunky nut butters. Nuts and seeds. Beans and lentils. Bacon. Hot dogs. Sausage. Dairy High-fat cheeses. Whole milk, chocolate milk, and beverages made with milk, such as milk shakes. Half-and-half. Cream. sour cream. Ice cream. Beverages Caffeinated beverages (such as coffee, tea, soda, or energy drinks). Alcoholic beverages. Fruit juices with pulp. Prune juice. Soft drinks sweetened with high-fructose corn syrup or sugar alcohols. High-calorie sports drinks. Fats and oils Butter. Cream sauces. Margarine. Salad oils. Plain salad dressings. Olives. Avocados. Mayonnaise. Sweets and desserts Sweet rolls, doughnuts, and sweet breads. Sugar-free desserts sweetened with sugar alcohols such as xylitol  and sorbitol. Seasoning and other foods Honey. Hot sauce. Chili powder. Gravy. Cream-based or milk-based soups. Pancakes and waffles. Summary  When you have diarrhea, the foods you eat and your eating habits are very important.  Make sure you get at least 8-10 cups of fluid each day, or enough to keep your urine clear or pale yellow.  Eat bland foods and gradually reintroduce healthy, nutrient-rich foods as tolerated, or as told by your health care provider.  Avoid high-fiber, fried, greasy, or spicy foods. This information is not intended to replace advice given to you by your health care provider. Make sure you discuss any questions you have with your health care provider. Document Released: 11/24/2003 Document Revised: 08/31/2016 Document Reviewed: 08/31/2016 Elsevier Interactive Patient Education  2017 Elsevier Inc.  

## 2016-12-20 NOTE — Progress Notes (Signed)
Guy Walton 26 y.o.   Chief Complaint  Patient presents with  . Abdominal Pain    x 3day  . Emesis    all day tuesday   . Diarrhea  . Fatigue    HISTORY OF PRESENT ILLNESS: This is a 26 y.o. male complaining of n/v, diarrhea, and diffuse colicky abdominal pain since last Monday.  Abdominal Pain  This is a new problem. The current episode started in the past 7 days. The onset quality is sudden. The problem occurs intermittently. The problem has been gradually improving. The pain is located in the generalized abdominal region. The pain is at a severity of 3/10. The pain is mild. The quality of the pain is colicky and cramping. The abdominal pain does not radiate. Associated symptoms include diarrhea, nausea and vomiting. Pertinent negatives include no dysuria, fever, headaches, hematochezia, hematuria, melena or myalgias. Nothing aggravates the pain. There is no history of irritable bowel syndrome.  Emesis   This is a new problem. The current episode started in the past 7 days. The problem occurs intermittently. The problem has been gradually improving. There has been no fever. Associated symptoms include abdominal pain and diarrhea. Pertinent negatives include no chest pain, coughing, dizziness, fever, headaches or myalgias. Risk factors include suspect food intake.  Diarrhea   This is a new problem. The current episode started in the past 7 days. The problem occurs 2 to 4 times per day. The problem has been gradually improving. The stool consistency is described as mucous and watery. Associated symptoms include abdominal pain and vomiting. Pertinent negatives include no coughing, fever, headaches or myalgias. Nothing aggravates the symptoms. Risk factors include suspect food intake. He has tried change of diet for the symptoms. The treatment provided mild relief. There is no history of inflammatory bowel disease, irritable bowel syndrome or a recent abdominal surgery.     Prior to  Admission medications   Medication Sig Start Date End Date Taking? Authorizing Provider  gabapentin (NEURONTIN) 300 MG capsule Take 1-2 tablets at bedtime as needed 02/28/16  Yes Guinevere Scarlet, MD  ibuprofen (ADVIL,MOTRIN) 600 MG tablet Take 1 tablet (600 mg total) by mouth every 8 (eight) hours as needed. 11/13/16  Yes Ofilia Neas, PA-C  methylphenidate (CONCERTA) 18 MG PO CR tablet  12/09/14  Yes Historical Provider, MD  sertraline (ZOLOFT) 50 MG tablet  01/10/15  Yes Historical Provider, MD    No Known Allergies  Patient Active Problem List   Diagnosis Date Noted  . Acute upper respiratory infection 09/27/2016  . Cough 09/27/2016  . Left sided sciatica 09/23/2014    Past Medical History:  Diagnosis Date  . ADHD (attention deficit hyperactivity disorder)   . Chronic low back pain   . Tourette's    questionable per family    Past Surgical History:  Procedure Laterality Date  . NO PAST SURGERIES      Social History   Social History  . Marital status: Significant Other    Spouse name: n/a  . Number of children: 0  . Years of education: N/A   Occupational History  . 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 Smoking status: Never Smoker  . Smokeless tobacco: Never Used  . Alcohol use No  . Drug use: No  . Sexual activity: Yes    Partners: Female   Other Topics Concern  .  Not on file   Social History Narrative   Lives with his grandparents.   He works at [employer] in [employer] center   [employer] of education:  H.S., starting courses at [employer]    Family History  Problem Relation Age of Onset  . Hypercholesterolemia Paternal Grandfather   . Hypertension Paternal Grandfather   . Hypothyroidism Paternal Grandmother   . Hypertension Paternal Grandmother   . Healthy Brother   . Healthy Sister      Review of Systems    Constitutional: Positive for malaise/fatigue. Negative for fever.  HENT: Negative.   Eyes: Negative.   Respiratory: Negative for cough and shortness of breath.   Cardiovascular: Negative for chest pain, palpitations and leg swelling.  Gastrointestinal: Positive for abdominal pain, diarrhea, nausea and vomiting. Negative for hematochezia and melena.  Genitourinary: Negative for dysuria and hematuria.  Musculoskeletal: Negative for back pain, myalgias and neck pain.  Skin: Negative for rash.  Neurological: Positive for weakness. Negative for dizziness and headaches.  Endo/Heme/Allergies: Negative.   All other systems reviewed and are negative.    Vitals:   12/20/16 1118  BP: 116/65  Pulse: 83  Resp: 16  Temp: 98.1 F (36.7 C)     Physical Exam  Constitutional: He is oriented to person, place, and time. He appears well-developed and well-nourished.  HENT:  Head: Normocephalic and atraumatic.  Nose: Nose normal.  Mouth/Throat: Oropharynx is clear and moist. No oropharyngeal exudate.  Eyes: Conjunctivae and EOM are normal. Pupils are equal, round, and reactive to light.  Neck: Normal range of motion. Neck supple. No JVD present. No thyromegaly present.  Cardiovascular: Normal rate, regular rhythm, normal heart sounds and intact distal pulses.   Pulmonary/Chest: Effort normal and breath sounds normal.  Abdominal: Soft. Bowel sounds are normal. He exhibits no distension. There is no tenderness.  Musculoskeletal: Normal range of motion.  Lymphadenopathy:    He has no cervical adenopathy.  Neurological: He is alert and oriented to person, place, and time. No sensory deficit. He exhibits normal muscle tone.  Skin: Skin is warm and dry. Capillary refill takes less than 2 seconds.  Psychiatric: He has a normal mood and affect. His behavior is normal.  Vitals reviewed.    ASSESSMENT & PLAN: Tobey was seen today for abdominal pain, emesis, diarrhea and fatigue.  Diagnoses and all  orders for this visit:  Acute gastroenteritis  Diarrhea, unspecified type  Non-intractable vomiting with nausea, unspecified vomiting type  Generalized abdominal pain  Other orders -     Cancel: POCT urinalysis dipstick -     Cancel: POCT Microscopic Urinalysis ([facility]) -     ondansetron (ZOFRAN) 4 MG tablet; Take 1 tablet (4 mg total) by mouth every 8 (eight) hours as needed for nausea or vomiting.    Patient Instructions       IF you received an x-ray today, you will receive an invoice from [facility]. Please contact [facility] at [phone] with questions or concerns regarding your invoice.   IF you received labwork today, you will receive an invoice from [facility]. Please contact [facility] at [phone] with questions or concerns regarding your invoice.   Our billing staff will not be able to assist you with questions regarding bills from these companies.  You will be contacted with the lab results as soon as they are available. The fastest way to get your results is to activate your My Chart account. Instructions are located on the last page of this  paperwork. If you have not heard from [place] regarding the results in 2 weeks, please contact this office.      Food Choices to Help Relieve Diarrhea, Adult When you have diarrhea, the foods you eat and your eating habits are very important. Choosing the right foods and drinks can help:  Relieve diarrhea.  Replace lost fluids and nutrients.  Prevent dehydration. What general guidelines should I follow? Relieving diarrhea   Choose foods with less than 2 g or .07 oz. of fiber per serving.  Limit fats to less than 8 tsp (38 g or 1.34 oz.) a day.  Avoid the following:  Foods and beverages sweetened with high-fructose corn syrup, honey, or sugar alcohols such as xylitol, sorbitol, and mannitol.  Foods that contain a lot of fat or sugar.  Fried, greasy, or spicy foods.  High-fiber grains, breads, and  cereals.  Raw fruits and vegetables.  Eat foods that are rich in probiotics. These foods include dairy products such as yogurt and fermented milk products. They help increase healthy bacteria in the stomach and intestines (gastrointestinal tract, or GI tract).  If you have lactose intolerance, avoid dairy products. These may make your diarrhea worse.  Take medicine to help stop diarrhea (antidiarrheal medicine) only as told by your health care provider. Replacing nutrients   Eat small meals or snacks every 3-4 hours.  Eat bland foods, such as white rice, toast, or baked potato, until your diarrhea starts to get better. Gradually reintroduce nutrient-rich foods as tolerated or as told by your health care provider. This includes:  Well-cooked protein foods.  Peeled, seeded, and soft-cooked fruits and vegetables.  Low-fat dairy products.  Take vitamin and mineral supplements as told by your health care provider. Preventing dehydration    Start by sipping water or a special solution to prevent dehydration (oral rehydration solution, ORS). Urine that is clear or pale yellow means that you are getting enough fluid.  Try to drink at least 8-10 cups of fluid each day to help replace lost fluids.  You may add other liquids in addition to water, such as clear juice or decaffeinated sports drinks, as tolerated or as told by your health care provider.  Avoid drinks with caffeine, such as coffee, tea, or soft drinks.  Avoid alcohol. What foods are recommended? The items listed may not be a complete list. Talk with your health care provider about what dietary choices are best for you. Grains  White rice. White, [place], or pita breads (fresh or toasted), including plain rolls, buns, or bagels. White pasta. Saltine, soda, or graham crackers. Pretzels. Low-fiber cereal. Cooked cereals made with water (such as cornmeal, farina, or cream cereals). Plain muffins. Matzo. Melba toast.  Zwieback. Vegetables  Potatoes (without the skin). Most well-cooked and canned vegetables without skins or seeds. Tender lettuce. Fruits  Apple sauce. Fruits canned in juice. Cooked apricots, cherries, grapefruit, peaches, pears, or plums. Fresh bananas and cantaloupe. Meats and other protein foods  Baked or boiled chicken. Eggs. Tofu. Fish. Seafood. Smooth nut butters. Ground or well-cooked tender beef, ham, veal, lamb, pork, or poultry. Dairy  Plain yogurt, kefir, and unsweetened liquid yogurt. Lactose-free milk, buttermilk, skim milk, or soy milk. Low-fat or nonfat hard cheese. Beverages  Water. Low-calorie sports drinks. Fruit juices without pulp. Strained tomato and vegetable juices. Decaffeinated teas. Sugar-free beverages not sweetened with sugar alcohols. Oral rehydration solutions, if approved by your health care provider. Seasoning and other foods  Bouillon, broth, or soups made from recommended foods.  What foods are not recommended? The items listed may not be a complete list. Talk with your health care provider about what dietary choices are best for you. Grains  Whole grain, whole wheat, bran, or rye breads, rolls, pastas, and crackers. Wild or brown rice. Whole grain or bran cereals. Barley. Oats and oatmeal. Corn tortillas or taco shells. Granola. Popcorn. Vegetables  Raw vegetables. Fried vegetables. Cabbage, broccoli, Brussels sprouts, artichokes, baked beans, beet greens, corn, kale, legumes, peas, sweet potatoes, and yams. Potato skins. Cooked spinach and cabbage. Fruits  Dried fruit, including raisins and dates. Raw fruits. Stewed or dried prunes. Canned fruits with syrup. Meat and other protein foods  Fried or fatty meats. Deli meats. Chunky nut butters. Nuts and seeds. Beans and lentils. Tomasa Blase. Hot dogs. Sausage. Dairy  High-fat cheeses. Whole milk, chocolate milk, and beverages made with milk, such as milk shakes. Half-and-half. Cream. sour cream. Ice cream. Beverages   Caffeinated beverages (such as coffee, tea, soda, or energy drinks). Alcoholic beverages. Fruit juices with pulp. Prune juice. Soft drinks sweetened with high-fructose corn syrup or sugar alcohols. High-calorie sports drinks. Fats and oils  Butter. Cream sauces. Margarine. Salad oils. Plain salad dressings. Olives. Avocados. Mayonnaise. Sweets and desserts  Sweet rolls, doughnuts, and sweet breads. Sugar-free desserts sweetened with sugar alcohols such as xylitol and sorbitol. Seasoning and other foods  Honey. Hot sauce. Chili powder. Gravy. Cream-based or milk-based soups. Pancakes and waffles. Summary  When you have diarrhea, the foods you eat and your eating habits are very important.  Make sure you get at least 8-10 cups of fluid each day, or enough to keep your urine clear or pale yellow.  Eat bland foods and gradually reintroduce healthy, nutrient-rich foods as tolerated, or as told by your health care provider.  Avoid high-fiber, fried, greasy, or spicy foods. This information is not intended to replace advice given to you by your health care provider. Make sure you discuss any questions you have with your health care provider. Document Released: 11/24/2003 Document Revised: 08/31/2016 Document Reviewed: 08/31/2016 Elsevier Interactive Patient Education  2017 Elsevier Inc.      Edwina Barth, MD [facility]

## 2016-12-21 ENCOUNTER — Telehealth: Payer: Self-pay | Admitting: Emergency Medicine

## 2016-12-21 NOTE — Telephone Encounter (Signed)
Advice

## 2016-12-21 NOTE — Telephone Encounter (Signed)
Patient called stating he is feeling worse than he did yesterday when he saw Dr Irving Shows. States he is not throwing up anymore and does not have stomach pain or diarrhea anymore, but now has a sore throat and fever with chills. He would like to get an antibotic to be called, told him that he may have to come in and be seen again since these are new conditions from what he was seen for yesterday.   Can someone give him a call at [phone]

## 2016-12-21 NOTE — Telephone Encounter (Signed)
Antibiotic is not what he needs. It will actually make matters worse with GI symptoms. This has to run its course but if worse should be seen again tomorrow.

## 2016-12-22 ENCOUNTER — Ambulatory Visit: Payer: BC Managed Care – PPO

## 2016-12-22 NOTE — Telephone Encounter (Signed)
Being seen today 

## 2017-03-26 ENCOUNTER — Ambulatory Visit (INDEPENDENT_AMBULATORY_CARE_PROVIDER_SITE_OTHER): Payer: BC Managed Care – PPO | Admitting: Urgent Care

## 2017-03-26 ENCOUNTER — Encounter: Payer: Self-pay | Admitting: Urgent Care

## 2017-03-26 VITALS — BP 146/96 | HR 89 | Temp 98.3°F | Resp 16 | Ht 73.5 in | Wt 202.8 lb

## 2017-03-26 DIAGNOSIS — M79674 Pain in right toe(s): Secondary | ICD-10-CM | POA: Diagnosis not present

## 2017-03-26 DIAGNOSIS — L600 Ingrowing nail: Secondary | ICD-10-CM | POA: Diagnosis not present

## 2017-03-26 NOTE — Progress Notes (Signed)
  MRN: [id number] DOB: 10/17/1990  Subjective:   Guy Walton is a 26 y.o. male presenting for chief complaint of Nail Problem (Ingrown, right big toe. Pain x 1 week )  Reports 1 week history of right great toe pain over inner portion of great toe nail. Patient has had this problem before but this is the worst it has been. Has had some draining over that portion of nail. Has had pain with walking, is having to adjust the way he walks.   Guy Walton has a current medication list which includes the following prescription(s): gabapentin, ibuprofen, methylphenidate, sertraline, and ondansetron. Also has No Known Allergies. Guy Walton  has a past medical history of ADHD (attention deficit hyperactivity disorder); Chronic low back pain; and Tourette's. Also  has a past surgical history that includes No past surgeries.  Objective:   Vitals: BP (!) 146/96   Pulse 89   Temp 98.3 F (36.8 C) (Oral)   Resp 16   Ht 6' 1.5" (1.867 m)   Wt 202 lb 12.8 oz (92 kg)   SpO2 98%   BMI 26.39 kg/m   Physical Exam  Constitutional: He is oriented to person, place, and time. He appears well-developed and well-nourished.  Cardiovascular: Normal rate.   Pulmonary/Chest: Effort normal.  Musculoskeletal:       Feet:  Neurological: He is alert and oriented to person, place, and time.   PROCEDURE NOTE: Ingrown Toenail Removal  Verbal consent obtained. Right great toe nail wiped with alcohol prep pad, then digital block with 3cc of 2% Xylocaine with 5cc of 0.5% Marcaine. Sterile prep and drape. Medial aspect of right great toe nail lifted and excised, lateral aspect left intact. Proximal aspect of nail bed explored revealing no nail remnants. Ingrown tissue debrided. No active bleeding. Xeroform dressing applied. Cleansed and dressed.  Assessment and Plan :   1. Ingrown right greater toenail 2. Pain around toenail, right foot - Medial portion of nail removed. Wound care reviewed. RTC prn.  Wallis BambergMario Mica Releford,  PA-C [facility] [phone] 03/26/2017  3:14 PM

## 2017-03-26 NOTE — Patient Instructions (Signed)
Ingrown Toenail An ingrown toenail occurs when the corner or sides of your toenail grow into the surrounding skin. The big toe is most commonly affected, but it can happen to any of your toes. If your ingrown toenail is not treated, you will be at risk for infection. What are the causes? This condition may be caused by:  Wearing shoes that are too small or tight.  Injury or trauma, such as stubbing your toe or having your toe stepped on.  Improper cutting or care of your toenails.  Being born with (congenital) nail or foot abnormalities, such as having a nail that is too big for your toe.  What increases the risk? Risk factors for an ingrown toenail include:  Age. Your nails tend to thicken as you get older, so ingrown nails are more common in older people.  Diabetes.  Cutting your toenails incorrectly.  Blood circulation problems.  What are the signs or symptoms? Symptoms may include:  Pain, soreness, or tenderness.  Redness.  Swelling.  Hardening of the skin surrounding the toe.  Your ingrown toenail may be infected if there is fluid, pus, or drainage. How is this diagnosed? An ingrown toenail may be diagnosed by medical history and physical exam. If your toenail is infected, your health care provider may test a sample of the drainage. How is this treated? Treatment depends on the severity of your ingrown toenail. Some ingrown toenails may be treated at home. More severe or infected ingrown toenails may require surgery to remove all or part of the nail. Infected ingrown toenails may also be treated with antibiotic medicines. Follow these instructions at home:  If you were prescribed an antibiotic medicine, finish all of it even if you start to feel better.  Soak your foot in warm soapy water for 20 minutes, 3 times per day or as directed by your health care provider.  Carefully lift the edge of the nail away from the sore skin by wedging a small piece of cotton under  the corner of the nail. This may help with the pain. Be careful not to cause more injury to the area.  Wear shoes that fit well. If your ingrown toenail is causing you pain, try wearing sandals, if possible.  Trim your toenails regularly and carefully. Do not cut them in a curved shape. Cut your toenails straight across. This prevents injury to the skin at the corners of the toenail.  Keep your feet clean and dry.  If you are having trouble walking and are given crutches by your health care provider, use them as directed.  Do not pick at your toenail or try to remove it yourself.  Take medicines only as directed by your health care provider.  Keep all follow-up visits as directed by your health care provider. This is important. Contact a health care provider if:  Your symptoms do not improve with treatment. Get help right away if:  You have red streaks that start at your foot and go up your leg.  You have a fever.  You have increased redness, swelling, or pain.  You have fluid, blood, or pus coming from your toenail. This information is not intended to replace advice given to you by your health care provider. Make sure you discuss any questions you have with your health care provider. Document Released: 08/31/2000 Document Revised: 02/03/2016 Document Reviewed: 07/28/2014 Elsevier Interactive Patient Education  2018 Elsevier Inc.  

## 2017-05-16 ENCOUNTER — Encounter: Payer: Self-pay | Admitting: Physician Assistant

## 2017-05-16 ENCOUNTER — Ambulatory Visit (INDEPENDENT_AMBULATORY_CARE_PROVIDER_SITE_OTHER): Payer: BC Managed Care – PPO | Admitting: Physician Assistant

## 2017-05-16 VITALS — BP 121/70 | HR 93 | Temp 98.2°F | Resp 16 | Ht 73.0 in | Wt 204.0 lb

## 2017-05-16 DIAGNOSIS — M5432 Sciatica, left side: Secondary | ICD-10-CM

## 2017-05-16 MED ORDER — IBUPROFEN 800 MG PO TABS: 400.0000 mg | ORAL_TABLET | Freq: Three times a day (TID) | ORAL | 0 refills | Status: AC

## 2017-05-16 MED ORDER — CYCLOBENZAPRINE HCL 10 MG PO TABS: 5.0000 mg | ORAL_TABLET | Freq: Three times a day (TID) | ORAL | 0 refills | Status: AC | PRN

## 2017-05-16 NOTE — Patient Instructions (Addendum)
Call in about a week if you are not getting better and we can plan on PO steroids.     IF you received an x-ray today, you will receive an invoice from [facility]. Please contact [facility] at [phone] with questions or concerns regarding your invoice.   IF you received labwork today, you will receive an invoice from [facility]. Please contact [facility] at [phone] with questions or concerns regarding your invoice.   Our billing staff will not be able to assist you with questions regarding bills from these companies.  You will be contacted with the lab results as soon as they are available. The fastest way to get your results is to activate your My Chart account. Instructions are located on the last page of this paperwork. If you have not heard from us regarding the results in 2 weeks, please contact this office.

## 2017-05-16 NOTE — Progress Notes (Signed)
05/16/2017 3:05 PM   DOB: 1990/11/08 / MRN: [id number]  SUBJECTIVE:  Guy Walton is a 26 y.o. male presenting for low back pain that started 3 days ago and is left sided and states that is startes at the left buttock and goes to the toes. Denies weakness. Flexeril has worked well for him in the past.  Has taken gabapentin in the past.  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   He has No Known Allergies.   He  has a past medical history of ADHD (attention deficit hyperactivity disorder); Chronic low back pain; and Tourette's.    He  reports that he has never smoked. He has never used smokeless tobacco. He reports that he does not drink alcohol or use drugs. He  reports that he currently engages in sexual activity and has had male partners. The patient  has a past surgical history that includes No past surgeries.  His family history includes Healthy in his brother and sister; Hypercholesterolemia in his paternal grandfather; Hypertension in his paternal grandfather and paternal grandmother; Hypothyroidism in his paternal grandmother.  Review of Systems  Constitutional: Negative for chills, diaphoresis and fever.  Eyes: Negative.   Gastrointestinal: Negative for nausea.  Skin: Negative for rash.  Neurological: Positive for tingling. Negative for dizziness, sensory change, speech change, focal weakness and headaches.    The problem list and medications were reviewed and updated by myself where necessary and exist elsewhere in the encounter.   OBJECTIVE:  BP 121/70 (BP Location: Right Arm, Patient Position: Sitting, Cuff Size: Normal)   Pulse 93   Temp 98.2 F (36.8 C) (Oral)   Resp 16   Ht 6\' 1"  (1.854 m)   Wt 204 lb (92.5 kg)   SpO2 95%   BMI 26.91 kg/m   Physical Exam  Constitutional: He is oriented to person, place, and time. He appears well-developed. He is active and cooperative.  Non-toxic appearance.  Eyes: Pupils are equal,  round, and reactive to light. EOM are normal.  Cardiovascular: Normal rate.   Pulmonary/Chest: Effort normal. No tachypnea.  Neurological: He is alert and oriented to person, place, and time. He has normal strength and normal reflexes. He is not disoriented. He displays normal reflexes. No cranial nerve deficit or sensory deficit. He exhibits normal muscle tone. Coordination and gait normal.  Tenderness about the left sciatic notch.  No rash or atrophy.   Skin: Skin is warm and dry. He is not diaphoretic. No pallor.  Psychiatric: His behavior is normal.  Vitals reviewed.   No results found for this or any previous visit (from the past 72 hour(s)).  No results found.  ASSESSMENT AND PLAN:  Guy Walton was seen today for back pain.  Diagnoses and all orders for this visit:  Left sided sciatica -     ibuprofen (ADVIL,MOTRIN) 800 MG tablet; Take 0.5-1 tablets (400-800 mg total) by mouth 3 (three) times daily. Take with food. Do not take other NSAID pain reliever with this medication. -     cyclobenzaprine (FLEXERIL) 10 MG tablet; Take 0.5-1 tablets (5-10 mg total) by mouth 3 (three) times daily as needed for muscle spasms. Do not mix with narcotics. May cause drowsiness.    The patient is advised to call or return to clinic if he does not see an improvement in symptoms, or to seek the care of the closest emergency department if he worsens with the above plan.   Deliah Boston, MHS,  PA-C [facility] at [facility] 05/16/2017 3:05 PM

## 2017-05-17 ENCOUNTER — Telehealth: Payer: Self-pay | Admitting: Physician Assistant

## 2017-05-17 NOTE — Telephone Encounter (Signed)
Note written, signed and left at the front desk for patient pick up./ S.Nicolis Boody,CMA

## 2017-05-17 NOTE — Telephone Encounter (Signed)
PATIENT STATES HE CAME IN TO SEE MICHAEL CLARK YESTERDAY (THURSDAY 05/16/17) FOR BACK PAIN. MICHAEL GAVE HIM A NOTE TO BE OUT OF WORK ON Thursday AND RETURNING TODAY (FRIDAY 05/17/17). HE SAID HE DOES A VERY DEMANDING JOB WORKING IN A [employer] AND PICKING UP HEAVY PACKAGES OF MEAT. HE WOULD  LIKE TO GET ANOTHER WORK NOTE TO BE OUT TODAY 05/17/17 AND RETURNING Saturday 05/18/17. PLEASE CALL HIM WHEN HE CAN PICK THE NOTE UP. BEST PHONE [phone] (CELL) MBC

## 2017-08-09 ENCOUNTER — Encounter: Payer: Self-pay | Admitting: Family Medicine

## 2017-08-09 ENCOUNTER — Other Ambulatory Visit: Payer: Self-pay

## 2017-08-09 ENCOUNTER — Ambulatory Visit: Payer: BC Managed Care – PPO | Admitting: Family Medicine

## 2017-08-09 VITALS — BP 110/78 | HR 97 | Temp 98.2°F | Ht 73.0 in | Wt 208.0 lb

## 2017-08-09 DIAGNOSIS — J069 Acute upper respiratory infection, unspecified: Secondary | ICD-10-CM

## 2017-08-09 DIAGNOSIS — J029 Acute pharyngitis, unspecified: Secondary | ICD-10-CM

## 2017-08-09 LAB — POC INFLUENZA A&B (BINAX/QUICKVUE)
Influenza A, POC: NEGATIVE
Influenza B, POC: NEGATIVE

## 2017-08-09 LAB — POCT RAPID STREP A (OFFICE): Rapid Strep A Screen: NEGATIVE

## 2017-08-09 MED ORDER — BENZONATATE 100 MG PO CAPS: 100.0000 mg | ORAL_CAPSULE | Freq: Three times a day (TID) | ORAL | 0 refills | Status: DC | PRN

## 2017-08-09 NOTE — Patient Instructions (Addendum)
     IF you received an x-ray today, you will receive an invoice from [facility]. Please contact [facility] at [phone] with questions or concerns regarding your invoice.   IF you received labwork today, you will receive an invoice from [facility]. Please contact [facility] at [phone] with questions or concerns regarding your invoice.   Our billing staff will not be able to assist you with questions regarding bills from these companies.  You will be contacted with the lab results as soon as they are available. The fastest way to get your results is to activate your My Chart account. Instructions are located on the last page of this paperwork. If you have not heard from us regarding the results in 2 weeks, please contact this office.     Upper Respiratory Infection, Adult Most upper respiratory infections (URIs) are caused by a virus. A URI affects the nose, throat, and upper air passages. The most common type of URI is often called "the common cold." Follow these instructions at home:  Take medicines only as told by your doctor.  Gargle warm saltwater or take cough drops to comfort your throat as told by your doctor.  Use a warm mist humidifier or inhale steam from a shower to increase air moisture. This may make it easier to breathe.  Drink enough fluid to keep your pee (urine) clear or pale yellow.  Eat soups and other clear broths.  Have a healthy diet.  Rest as needed.  Go back to work when your fever is gone or your doctor says it is okay. ? You may need to stay home longer to avoid giving your URI to others. ? You can also wear a face mask and wash your hands often to prevent spread of the virus.  Use your inhaler more if you have asthma.  Do not use any tobacco products, including cigarettes, chewing tobacco, or electronic cigarettes. If you need help quitting, ask your doctor. Contact a doctor if:  You are getting worse, not better.  Your  symptoms are not helped by medicine.  You have chills.  You are getting more short of breath.  You have brown or red mucus.  You have yellow or brown discharge from your nose.  You have pain in your face, especially when you bend forward.  You have a fever.  You have puffy (swollen) neck glands.  You have pain while swallowing.  You have white areas in the back of your throat. Get help right away if:  You have very bad or constant: ? Headache. ? Ear pain. ? Pain in your forehead, behind your eyes, and over your cheekbones (sinus pain). ? Chest pain.  You have long-lasting (chronic) lung disease and any of the following: ? Wheezing. ? Long-lasting cough. ? Coughing up blood. ? A change in your usual mucus.  You have a stiff neck.  You have changes in your: ? Vision. ? Hearing. ? Thinking. ? Mood. This information is not intended to replace advice given to you by your health care provider. Make sure you discuss any questions you have with your health care provider. Document Released: 02/20/2008 Document Revised: 05/06/2016 Document Reviewed: 12/09/2013 Elsevier Interactive Patient Education  2018 Elsevier Inc.  

## 2017-08-09 NOTE — Progress Notes (Signed)
11/23/201812:43 PM  Guy Walton Apr 05, 1991, 26 y.o. male [id number]  Chief Complaint  Patient presents with  . Cough    x 2 days  . Sore Throat  . Nasal Congestion    HPI:   Patient is a 26 y.o. male with who presents today for 2 days of not feeling well. Started with a sore throat, then next day woke up with nasal congestion and cough. Felt feverish earlier today, took ibuprofen, now feeling better. Has not had flu vaccine this season. Has a 444 month old at home. No known sick contacts. Denies any SOB, nausea, vomiting, diarrhea, rash.  [facility] screen [facility] 08/09/2017 05/16/2017 03/26/2017  Decreased Interest 0 0 0  Down, Depressed, Hopeless 0 0 -  PHQ - 2 Score 0 0 0    No Known Allergies  Prior to Admission medications   Medication Sig Start Date End Date Taking? Authorizing Provider  gabapentin (NEURONTIN) 300 MG capsule Take 1-2 tablets at bedtime as needed 02/28/16  Yes Guinevere ScarletWilliams, Blake, MD  methylphenidate (CONCERTA) 18 MG PO CR tablet  12/09/14  Yes [provider]  sertraline (ZOLOFT) 50 MG tablet  01/10/15  Yes [provider]  cyclobenzaprine (FLEXERIL) 10 MG tablet Take 0.5-1 tablets (5-10 mg total) by mouth 3 (three) times daily as needed for muscle spasms. Do not mix with narcotics. May cause drowsiness. Patient not taking: Reported on 08/09/2017 05/16/17   Silvestre Mesilark, Michael L, PA-C    Past Medical History:  Diagnosis Date  . ADHD (attention deficit hyperactivity disorder)   . Chronic low back pain   . Tourette's    questionable per family    Past Surgical History:  Procedure Laterality Date  . NO PAST SURGERIES      Social History   Tobacco Use  . Smoking status: Never Smoker  . Smokeless tobacco: Never Used  Substance Use Topics  . Alcohol use: No    Alcohol/week: 0.0 oz    Family History  Problem Relation Age of Onset  . Hypercholesterolemia Paternal Grandfather   . Hypertension Paternal Grandfather   . Hypothyroidism  Paternal Grandmother   . Hypertension Paternal Grandmother   . Healthy Brother   . Healthy Sister     ROS Per hpi  OBJECTIVE:  Blood pressure 110/78, pulse 97, temperature 98.2 F (36.8 C), temperature source Oral, height 6\' 1"  (1.854 m), weight 208 lb (94.3 kg), SpO2 98 %.  Physical Exam  Constitutional: He is oriented to person, place, and time and well-developed, well-nourished, and in no distress.  HENT:  Head: Normocephalic and atraumatic.  Right Ear: Hearing, tympanic membrane, external ear and ear canal normal.  Left Ear: Hearing, tympanic membrane, external ear and ear canal normal.  Mouth/Throat: Oropharynx is clear and moist. No oropharyngeal exudate.  Eyes: Conjunctivae and EOM are normal. Pupils are equal, round, and reactive to light.  Neck: Neck supple.  Cardiovascular: Normal rate and regular rhythm. Exam reveals no gallop and no friction rub.  No murmur heard. Pulmonary/Chest: Effort normal and breath sounds normal. He has no wheezes. He has no rales.  Lymphadenopathy:    He has no cervical adenopathy.  Neurological: He is alert and oriented to person, place, and time. Gait normal.  Skin: Skin is warm and dry.      Results for orders placed or performed in visit on 08/09/17 (from the past 24 hour(s))  POCT rapid strep A     Status: Normal   Collection Time: 08/09/17 12:49 PM  Result Value Ref Range   Rapid Strep A Screen Negative Negative  POC Influenza A&B(BINAX/QUICKVUE)     Status: Normal   Collection Time: 08/09/17  1:04 PM  Result Value Ref Range   Influenza A, POC Negative Negative   Influenza B, POC Negative Negative    ASSESSMENT and PLAN  1. Acute upper respiratory infection Discussed supportive measures for URI: increase hydration, rest, OTC medications, etc. RTC precautions discussed. - POC Influenza A&B(BINAX/QUICKVUE)  2. Sore throat - POCT rapid strep A  Other orders - benzonatate (TESSALON) 100 MG capsule; Take 1 capsule (100 mg  total) by mouth 3 (three) times daily as needed for cough.  Return if symptoms worsen or fail to improve.    Myles LippsIrma M Santiago, MD [facility] [address] [place], [place] [address] Ph.  [phone] Fax [phone]

## 2017-08-12 ENCOUNTER — Ambulatory Visit: Payer: BC Managed Care – PPO | Admitting: Family Medicine

## 2017-11-30 ENCOUNTER — Ambulatory Visit: Payer: BC Managed Care – PPO | Admitting: Family Medicine

## 2017-11-30 ENCOUNTER — Encounter: Payer: Self-pay | Admitting: Family Medicine

## 2017-11-30 ENCOUNTER — Other Ambulatory Visit: Payer: Self-pay

## 2017-11-30 VITALS — BP 132/72 | HR 83 | Temp 98.2°F | Resp 18 | Ht 73.82 in | Wt 215.2 lb

## 2017-11-30 DIAGNOSIS — J069 Acute upper respiratory infection, unspecified: Secondary | ICD-10-CM

## 2017-11-30 MED ORDER — IPRATROPIUM BROMIDE 0.03 % NA SOLN: 2.0000 | Freq: Two times a day (BID) | NASAL | 0 refills | Status: AC

## 2017-11-30 MED ORDER — BENZONATATE 100 MG PO CAPS: 100.0000 mg | ORAL_CAPSULE | Freq: Three times a day (TID) | ORAL | 0 refills | Status: AC | PRN

## 2017-11-30 NOTE — Patient Instructions (Addendum)
   Call Dr. Nilda SimmerKristi Smith if not feeling better by Wednesday. Continue Mucinex D.   Continue  Advil for headache or fever.  IF you received an x-ray today, you will receive an invoice from [facility]. Please contact [facility] at [phone] with questions or concerns regarding your invoice.   IF you received labwork today, you will receive an invoice from [facility]. Please contact [facility] at [phone] with questions or concerns regarding your invoice.   Our billing staff will not be able to assist you with questions regarding bills from these companies.  You will be contacted with the lab results as soon as they are available. The fastest way to get your results is to activate your My Chart account. Instructions are located on the last page of this paperwork. If you have not heard from us regarding the results in 2 weeks, please contact this office.     Upper Respiratory Infection, Adult Most upper respiratory infections (URIs) are caused by a virus. A URI affects the nose, throat, and upper air passages. The most common type of URI is often called "the common cold." Follow these instructions at home:  Take medicines only as told by your doctor.  Gargle warm saltwater or take cough drops to comfort your throat as told by your doctor.  Use a warm mist humidifier or inhale steam from a shower to increase air moisture. This may make it easier to breathe.  Drink enough fluid to keep your pee (urine) clear or pale yellow.  Eat soups and other clear broths.  Have a healthy diet.  Rest as needed.  Go back to work when your fever is gone or your doctor says it is okay. ? You may need to stay home longer to avoid giving your URI to others. ? You can also wear a face mask and wash your hands often to prevent spread of the virus.  Use your inhaler more if you have asthma.  Do not use any tobacco products, including cigarettes, chewing tobacco, or electronic  cigarettes. If you need help quitting, ask your doctor. Contact a doctor if:  You are getting worse, not better.  Your symptoms are not helped by medicine.  You have chills.  You are getting more short of breath.  You have brown or red mucus.  You have yellow or brown discharge from your nose.  You have pain in your face, especially when you bend forward.  You have a fever.  You have puffy (swollen) neck glands.  You have pain while swallowing.  You have white areas in the back of your throat. Get help right away if:  You have very bad or constant: ? Headache. ? Ear pain. ? Pain in your forehead, behind your eyes, and over your cheekbones (sinus pain). ? Chest pain.  You have long-lasting (chronic) lung disease and any of the following: ? Wheezing. ? Long-lasting cough. ? Coughing up blood. ? A change in your usual mucus.  You have a stiff neck.  You have changes in your: ? Vision. ? Hearing. ? Thinking. ? Mood. This information is not intended to replace advice given to you by your health care provider. Make sure you discuss any questions you have with your health care provider. Document Released: 02/20/2008 Document Revised: 05/06/2016 Document Reviewed: 12/09/2013 Elsevier Interactive Patient Education  2018 [employer]

## 2017-11-30 NOTE — Progress Notes (Signed)
Subjective:    Patient ID: Guy Walton, male    DOB: 01-Nov-1990, 27 y.o.   MRN: [id number]  11/30/2017  Cough (X 4 days) and Sore Throat (X 1 week - with drainage mucus)    HPI This 27 y.o. male presents for evaluation of cough and sore throat.   Onset nine days.  No fever; body aches; chills; no sweats.  No recent fever symptoms.  Intermittent.  At night, symptoms worsen.  Upon awakening, symptoms worsen.  As day goes on, symptoms improve.  Nasal congestion green.  No headache.  L ear pain; +ST all week.  Mild pain with swallowing.  +sinus pressure.  +coughing started 3-4 days ago; mild sputum production. Rare DOE.  No n/v/d.  Taking Mucinex D and Advil.  No nasal sprays.     BP Readings from Last 3 Encounters:  11/30/17 132/72  08/09/17 110/78  05/16/17 121/70   Wt Readings from Last 3 Encounters:  11/30/17 215 lb 3.2 oz (97.6 kg)  08/09/17 208 lb (94.3 kg)  05/16/17 204 lb (92.5 kg)    There is no immunization history on file for this patient.  Review of Systems  Constitutional: Negative for activity change, appetite change, chills, diaphoresis, fatigue and fever.  HENT: Positive for congestion, ear pain, postnasal drip, rhinorrhea, sinus pressure, sinus pain, sore throat and voice change. Negative for mouth sores and trouble swallowing.   Respiratory: Positive for cough. Negative for shortness of breath.   Cardiovascular: Negative for chest pain, palpitations and leg swelling.  Gastrointestinal: Negative for abdominal pain, diarrhea, nausea and vomiting.  Endocrine: Negative for cold intolerance, heat intolerance, polydipsia, polyphagia and polyuria.  Skin: Negative for color change, rash and wound.  Neurological: Negative for dizziness, tremors, seizures, syncope, facial asymmetry, speech difficulty, weakness, light-headedness, numbness and headaches.  Psychiatric/Behavioral: Negative for dysphoric mood and sleep disturbance. The patient is not nervous/anxious.      Past Medical History:  Diagnosis Date  . ADHD (attention deficit hyperactivity disorder)   . Chronic low back pain   . Tourette's    questionable per family   Past Surgical History:  Procedure Laterality Date  . NO PAST SURGERIES     No Known Allergies Current Outpatient Medications on File Prior to Visit  Medication Sig Dispense Refill  . cyclobenzaprine (FLEXERIL) 10 MG tablet Take 0.5-1 tablets (5-10 mg total) by mouth 3 (three) times daily as needed for muscle spasms. Do not mix with narcotics. May cause drowsiness. 30 tablet 0  . gabapentin (NEURONTIN) 300 MG capsule Take 1-2 tablets at bedtime as needed 180 capsule 0  . methylphenidate 54 MG PO CR tablet TK 1 T PO QD IN THE MORNING  0  . sertraline (ZOLOFT) 100 MG tablet TK 1 T PO QD  1   No current facility-administered medications on file prior to visit.    Social History   Socioeconomic History  . Marital status: Significant Other    Spouse name: n/a  . Number of children: 0  . Years of education: Not on file  . Highest education level: Not on file  Social Needs  . Financial resource strain: Not on file  . Food insecurity - worry: Not on file  . Food insecurity - inability: Not on file  . Transportation needs - medical: Not on file  . Transportation needs - non-medical: Not on file  Occupational History  . Occupation: Dentist: [employer]    Comment: [employer]; physical therapy assistant program  .  Occupation: freezer selector    Comment: [employer]  Tobacco Use  . Smoking status: Never Smoker  . Smokeless tobacco: Never Used  Substance and Sexual Activity  . Alcohol use: No    Alcohol/week: 0.0 oz  . Drug use: No  . Sexual activity: Yes    Partners: Female  Other Topics Concern  . Not on file  Social History Narrative   Lives with his grandparents.   He works at [employer] in [employer] center   [employer] of education:  H.S., starting courses  at [employer]   Family History  Problem Relation Age of Onset  . Hypercholesterolemia Paternal Grandfather   . Hypertension Paternal Grandfather   . Hypothyroidism Paternal Grandmother   . Hypertension Paternal Grandmother   . Healthy Brother   . Healthy Sister        Objective:    BP 132/72 (BP Location: Right Arm, Patient Position: Sitting, Cuff Size: Normal)   Pulse 83   Temp 98.2 F (36.8 C) (Oral)   Resp 18   Ht 6' 1.82" (1.875 m)   Wt 215 lb 3.2 oz (97.6 kg)   SpO2 96%   BMI 27.77 kg/m  Physical Exam  Constitutional: He is oriented to person, place, and time. He appears well-developed and well-nourished. No distress.  HENT:  Head: Normocephalic and atraumatic.  Right Ear: External ear and ear canal normal. Tympanic membrane is retracted.  Left Ear: External ear and ear canal normal. Tympanic membrane is retracted.  Nose: Right sinus exhibits maxillary sinus tenderness. Right sinus exhibits no frontal sinus tenderness. Left sinus exhibits maxillary sinus tenderness. Left sinus exhibits no frontal sinus tenderness.  Mouth/Throat: Uvula is midline and mucous membranes are normal. Posterior oropharyngeal erythema present. No oropharyngeal exudate, posterior oropharyngeal edema or tonsillar abscesses.  Eyes: Conjunctivae and EOM are normal. Pupils are equal, round, and reactive to light.  Neck: Normal range of motion. Neck supple. Carotid bruit is not present. No thyromegaly present.  Cardiovascular: Normal rate, regular rhythm, normal heart sounds and intact distal pulses. Exam reveals no gallop and no friction rub.  No murmur heard. Pulmonary/Chest: Effort normal and breath sounds normal. He has no wheezes. He has no rales.  Lymphadenopathy:    He has no cervical adenopathy.  Neurological: He is alert and oriented to person, place, and time. No cranial nerve deficit.  Skin: Skin is warm and dry. No rash noted. He is not diaphoretic.  Psychiatric: He has a normal mood and  affect. His behavior is normal.  Nursing note and vitals reviewed.  No results found. Depression screen [facility] 2/9 11/30/2017 08/09/2017 05/16/2017 03/26/2017 12/20/2016  Decreased Interest 0 0 0 0 0  Down, Depressed, Hopeless 0 0 0 - 0  PHQ - 2 Score 0 0 0 0 0   Fall Risk  11/30/2017 08/09/2017 05/16/2017 03/26/2017 12/20/2016  Falls in the past year? No No No No No  Risk for fall due to : - - - - -        Assessment & Plan:   1. Viral URI     New.  Rx provided for symptomatic relief.  Continue Mucinex D and Advil. Call on Wednesday if no improvement or if clinically worsens.  No orders of the defined types were placed in this encounter.  Meds ordered this encounter  Medications  . ipratropium (ATROVENT) 0.03 % nasal spray    Sig: Place 2 sprays into both nostrils 2 (two) times daily.  Dispense:  30 mL    Refill:  0  . benzonatate (TESSALON) 100 MG capsule    Sig: Take 1-2 capsules (100-200 mg total) by mouth 3 (three) times daily as needed for cough.    Dispense:  40 capsule    Refill:  0    No Follow-up on file.   Kristi Paulita FujitaMartin Smith, M.D. [facility]  [place] [facility] [address] [place], [place]  [id number] [phone] phone [phone] fax

## 2018-01-31 ENCOUNTER — Encounter: Payer: Self-pay | Admitting: Family Medicine

## 2018-02-05 ENCOUNTER — Encounter: Payer: Self-pay | Admitting: Family Medicine
